# Patient Record
Sex: Male | Born: 1945 | Race: Black or African American | Hispanic: No | Marital: Single | Smoking: Never smoker
Health system: Southern US, Community
[De-identification: ages and names within clinical notes are randomized; demographics above are authoritative.]

## PROBLEM LIST (undated history)

## (undated) DIAGNOSIS — N400 Enlarged prostate without lower urinary tract symptoms: Secondary | ICD-10-CM

## (undated) DIAGNOSIS — D696 Thrombocytopenia, unspecified: Secondary | ICD-10-CM

## (undated) DIAGNOSIS — E538 Deficiency of other specified B group vitamins: Secondary | ICD-10-CM

## (undated) DIAGNOSIS — R569 Unspecified convulsions: Secondary | ICD-10-CM

## (undated) DIAGNOSIS — F01518 Vascular dementia, unspecified severity, with other behavioral disturbance: Secondary | ICD-10-CM

## (undated) DIAGNOSIS — R293 Abnormal posture: Secondary | ICD-10-CM

## (undated) DIAGNOSIS — E119 Type 2 diabetes mellitus without complications: Secondary | ICD-10-CM

## (undated) DIAGNOSIS — R7881 Bacteremia: Secondary | ICD-10-CM

## (undated) DIAGNOSIS — F209 Schizophrenia, unspecified: Secondary | ICD-10-CM

## (undated) DIAGNOSIS — F0151 Vascular dementia with behavioral disturbance: Secondary | ICD-10-CM

## (undated) DIAGNOSIS — D731 Hypersplenism: Secondary | ICD-10-CM

## (undated) DIAGNOSIS — I639 Cerebral infarction, unspecified: Secondary | ICD-10-CM

## (undated) DIAGNOSIS — N179 Acute kidney failure, unspecified: Secondary | ICD-10-CM

## (undated) DIAGNOSIS — R1310 Dysphagia, unspecified: Secondary | ICD-10-CM

## (undated) DIAGNOSIS — G9341 Metabolic encephalopathy: Secondary | ICD-10-CM

## (undated) DIAGNOSIS — F0390 Unspecified dementia without behavioral disturbance: Secondary | ICD-10-CM

## (undated) DIAGNOSIS — F319 Bipolar disorder, unspecified: Secondary | ICD-10-CM

---

## 2017-03-05 ENCOUNTER — Other Ambulatory Visit (HOSPITAL_COMMUNITY): Payer: Self-pay | Admitting: Internal Medicine

## 2017-03-05 DIAGNOSIS — R1310 Dysphagia, unspecified: Secondary | ICD-10-CM

## 2017-03-12 ENCOUNTER — Ambulatory Visit (HOSPITAL_COMMUNITY)
Admission: RE | Admit: 2017-03-12 | Discharge: 2017-03-12 | Disposition: A | Discharge status: Home / Self Care | Payer: Medicare Other | Source: Ambulatory Visit | Attending: Internal Medicine | Admitting: Internal Medicine

## 2017-03-12 DIAGNOSIS — R1310 Dysphagia, unspecified: Secondary | ICD-10-CM

## 2017-03-12 NOTE — Progress Notes (Deleted)
Modified Barium Swallow Progress Note  Patient Details  Name: Aaron Watkins MRN: [id number] Date of Birth: 1945/06/03  Today's Date: 03/12/2017  Modified Barium Swallow completed.  Full report located under Chart Review in the Imaging Section.  Brief recommendations include the following:  Clinical Impression  Pt presents with mildoropharyngeal dysphagia, characterized by sensory and motor impairments. Orally, pt demonstrates poor bolus formation with posterior spill prior to initiation of the swallow reflex. Pharyngeally, pt exhibits spillage to the vallecula on thin, nectar thick and puree consistencies, and spillage to the pyriform sinus on soft solid (due to time and attention to mastication). Penetration and aspiration of thin liquid via straw was noted during the swallow. Immediate, effective reflexive cough was noted. Vallecular residue was noted across consistencies as well, which pt eventually cleared with dry swallows.    Swallow Evaluation Recommendations  SLP Diet Recommendations: Dysphagia 1 (Puree) solids;Thin liquid   Liquid Administration via: Cup;No straw   Medication Administration: Other (Comment)(1 at a time)   Supervision: Patient able to self feed;Staff to assist with self feeding;Full supervision/cueing for compensatory strategies   Compensations: Minimize environmental distractions;Slow rate;Small sips/bites   Postural Changes: Seated upright at 90 degrees   Oral Care Recommendations: Oral care BID     Aaron Watkins, [facility] Hospital[facility]P, CCC-SLP Speech Language Pathologist [phone]  Aaron Watkins, Lucero Auzenne Lorenzi 03/12/2017,12:51 PM

## 2017-03-12 NOTE — Progress Notes (Signed)
Modified Barium Swallow Progress Note  Patient Details  Name: Aaron Watkins MRN: [id number] Date of Birth: 04-04-1946  Today's Date: 03/12/2017  Modified Barium Swallow completed.  Full report located under Chart Review in the Imaging Section.  Brief recommendations include the following:  Clinical Impression   Pt presents with mild oropharyngeal dysphagia, characterized by sensory and motor impairments.   Orally, pt demonstrates poor bolus formation with posterior spill prior to initiation of the swallow reflex.   Pharyngeally, pt exhibits spillage to the vallecula on thin, nectar thick and puree consistencies, and spillage to the pyriform sinus on soft solid (due to time and attention to mastication). Penetration and aspiration during the swallow of thin liquid (via straw only) was noted. Immediate, effective reflexive cough was noted. No penetration or aspiration of thin liquid via cup sip was seen, despite multiple presentations and large/consecutive boluses. Vallecular residue was noted across consistencies as well, which pt eventually cleared with dry swallows. Pt tolerated barium tablet whole with liquid. No difficulty or delay exhibited.   Recommend puree diet, given significant delay in swallow reflex on items requiring mastication, as this significantly increases aspiration risk. Recommend thin liquids via CUP SIP ONLY (NO STRAWS). Meds may be given whole, one at a time with liquid.     Swallow Evaluation Recommendations  SLP Diet Recommendations: Dysphagia 1 (Puree) solids;Thin liquid   Liquid Administration via: Cup;No straw   Medication Administration: Other (Comment)(1 at a time)   Supervision: Patient able to self feed;Staff to assist with self feeding;Full supervision/cueing for compensatory strategies   Compensations: Minimize environmental distractions;Slow rate;Small sips/bites   Postural Changes: Seated upright at 90 degrees   Oral Care Recommendations:  Oral care BID     Celia B. Murvin NatalBueche, [facility] IncMSP, CCC-SLP Speech Language Pathologist [phone]  Leigh AuroraBueche, Celia Hagey 03/12/2017,2:06 PM

## 2018-01-04 ENCOUNTER — Inpatient Hospital Stay (HOSPITAL_COMMUNITY)
Admission: EM | Admit: 2018-01-04 | Discharge: 2018-01-10 | DRG: 871 | Disposition: A | Discharge status: Skilled Nursing Facility | Payer: Medicare Other | Attending: Internal Medicine | Admitting: Internal Medicine

## 2018-01-04 ENCOUNTER — Encounter (HOSPITAL_COMMUNITY): Payer: Self-pay

## 2018-01-04 ENCOUNTER — Emergency Department (HOSPITAL_COMMUNITY): Payer: Medicare Other

## 2018-01-04 DIAGNOSIS — G92 Toxic encephalopathy: Secondary | ICD-10-CM | POA: Diagnosis present

## 2018-01-04 DIAGNOSIS — E870 Hyperosmolality and hypernatremia: Secondary | ICD-10-CM | POA: Diagnosis not present

## 2018-01-04 DIAGNOSIS — Z7189 Other specified counseling: Secondary | ICD-10-CM

## 2018-01-04 DIAGNOSIS — E119 Type 2 diabetes mellitus without complications: Secondary | ICD-10-CM | POA: Diagnosis present

## 2018-01-04 DIAGNOSIS — E860 Dehydration: Secondary | ICD-10-CM | POA: Diagnosis present

## 2018-01-04 DIAGNOSIS — I10 Essential (primary) hypertension: Secondary | ICD-10-CM | POA: Diagnosis present

## 2018-01-04 DIAGNOSIS — Z8673 Personal history of transient ischemic attack (TIA), and cerebral infarction without residual deficits: Secondary | ICD-10-CM

## 2018-01-04 DIAGNOSIS — H9190 Unspecified hearing loss, unspecified ear: Secondary | ICD-10-CM | POA: Diagnosis present

## 2018-01-04 DIAGNOSIS — J690 Pneumonitis due to inhalation of food and vomit: Secondary | ICD-10-CM | POA: Diagnosis present

## 2018-01-04 DIAGNOSIS — E538 Deficiency of other specified B group vitamins: Secondary | ICD-10-CM | POA: Diagnosis present

## 2018-01-04 DIAGNOSIS — Z5320 Procedure and treatment not carried out because of patient's decision for unspecified reasons: Secondary | ICD-10-CM | POA: Diagnosis present

## 2018-01-04 DIAGNOSIS — F209 Schizophrenia, unspecified: Secondary | ICD-10-CM | POA: Diagnosis present

## 2018-01-04 DIAGNOSIS — F0151 Vascular dementia with behavioral disturbance: Secondary | ICD-10-CM | POA: Diagnosis present

## 2018-01-04 DIAGNOSIS — G9340 Encephalopathy, unspecified: Secondary | ICD-10-CM | POA: Diagnosis present

## 2018-01-04 DIAGNOSIS — D751 Secondary polycythemia: Secondary | ICD-10-CM | POA: Diagnosis present

## 2018-01-04 DIAGNOSIS — F0390 Unspecified dementia without behavioral disturbance: Secondary | ICD-10-CM | POA: Diagnosis present

## 2018-01-04 DIAGNOSIS — E876 Hypokalemia: Secondary | ICD-10-CM | POA: Diagnosis present

## 2018-01-04 DIAGNOSIS — N400 Enlarged prostate without lower urinary tract symptoms: Secondary | ICD-10-CM | POA: Diagnosis present

## 2018-01-04 DIAGNOSIS — A419 Sepsis, unspecified organism: Secondary | ICD-10-CM | POA: Diagnosis not present

## 2018-01-04 DIAGNOSIS — R402142 Coma scale, eyes open, spontaneous, at arrival to emergency department: Secondary | ICD-10-CM | POA: Diagnosis present

## 2018-01-04 DIAGNOSIS — R402352 Coma scale, best motor response, localizes pain, at arrival to emergency department: Secondary | ICD-10-CM | POA: Diagnosis present

## 2018-01-04 DIAGNOSIS — R0602 Shortness of breath: Secondary | ICD-10-CM

## 2018-01-04 DIAGNOSIS — R001 Bradycardia, unspecified: Secondary | ICD-10-CM | POA: Diagnosis not present

## 2018-01-04 DIAGNOSIS — Z79899 Other long term (current) drug therapy: Secondary | ICD-10-CM

## 2018-01-04 DIAGNOSIS — R402212 Coma scale, best verbal response, none, at arrival to emergency department: Secondary | ICD-10-CM | POA: Diagnosis present

## 2018-01-04 DIAGNOSIS — X58XXXA Exposure to other specified factors, initial encounter: Secondary | ICD-10-CM | POA: Diagnosis not present

## 2018-01-04 DIAGNOSIS — F319 Bipolar disorder, unspecified: Secondary | ICD-10-CM | POA: Diagnosis present

## 2018-01-04 DIAGNOSIS — R1310 Dysphagia, unspecified: Secondary | ICD-10-CM | POA: Diagnosis present

## 2018-01-04 DIAGNOSIS — T441X5A Adverse effect of other parasympathomimetics [cholinergics], initial encounter: Secondary | ICD-10-CM | POA: Diagnosis not present

## 2018-01-04 DIAGNOSIS — Z515 Encounter for palliative care: Secondary | ICD-10-CM

## 2018-01-04 DIAGNOSIS — D7589 Other specified diseases of blood and blood-forming organs: Secondary | ICD-10-CM | POA: Diagnosis present

## 2018-01-04 DIAGNOSIS — I248 Other forms of acute ischemic heart disease: Secondary | ICD-10-CM | POA: Diagnosis present

## 2018-01-04 HISTORY — DX: Benign prostatic hyperplasia without lower urinary tract symptoms: N40.0

## 2018-01-04 HISTORY — DX: Dysphagia, unspecified: R13.10

## 2018-01-04 HISTORY — DX: Acute kidney failure, unspecified: N17.9

## 2018-01-04 HISTORY — DX: Type 2 diabetes mellitus without complications: E11.9

## 2018-01-04 HISTORY — DX: Unspecified convulsions: R56.9

## 2018-01-04 HISTORY — DX: Bacteremia: R78.81

## 2018-01-04 HISTORY — DX: Vascular dementia with behavioral disturbance: F01.51

## 2018-01-04 HISTORY — DX: Abnormal posture: R29.3

## 2018-01-04 HISTORY — DX: Thrombocytopenia, unspecified: D69.6

## 2018-01-04 HISTORY — DX: Cerebral infarction, unspecified: I63.9

## 2018-01-04 HISTORY — DX: Deficiency of other specified B group vitamins: E53.8

## 2018-01-04 HISTORY — DX: Vascular dementia, unspecified severity, with other behavioral disturbance: F01.518

## 2018-01-04 HISTORY — DX: Schizophrenia, unspecified: F20.9

## 2018-01-04 HISTORY — DX: Bipolar disorder, unspecified: F31.9

## 2018-01-04 HISTORY — DX: Hypersplenism: D73.1

## 2018-01-04 HISTORY — DX: Metabolic encephalopathy: G93.41

## 2018-01-04 HISTORY — DX: Unspecified dementia, unspecified severity, without behavioral disturbance, psychotic disturbance, mood disturbance, and anxiety: F03.90

## 2018-01-04 LAB — URINALYSIS, ROUTINE W REFLEX MICROSCOPIC
BILIRUBIN URINE: NEGATIVE
Glucose, UA: NEGATIVE mg/dL
KETONES UR: NEGATIVE mg/dL
Leukocytes, UA: NEGATIVE
Nitrite: NEGATIVE
PROTEIN: 100 mg/dL — AB
Specific Gravity, Urine: 1.029 (ref 1.005–1.030)
pH: 5 (ref 5.0–8.0)

## 2018-01-04 MED ORDER — SODIUM CHLORIDE 0.9 % IV SOLN
2.0000 g | Freq: Once | INTRAVENOUS | Status: AC
  Administered 2018-01-04: 2 g via INTRAVENOUS
  Filled 2018-01-04: qty 2

## 2018-01-04 MED ORDER — METRONIDAZOLE IN NACL 5-0.79 MG/ML-% IV SOLN
500.0000 mg | Freq: Three times a day (TID) | INTRAVENOUS | Status: DC
  Administered 2018-01-04 – 2018-01-05 (×2): 500 mg via INTRAVENOUS
  Filled 2018-01-04 (×2): qty 100

## 2018-01-04 MED ORDER — VANCOMYCIN HCL IN DEXTROSE 1-5 GM/200ML-% IV SOLN
1000.0000 mg | Freq: Once | INTRAVENOUS | Status: AC
  Administered 2018-01-05: 1000 mg via INTRAVENOUS
  Filled 2018-01-04: qty 200

## 2018-01-04 MED ORDER — ACETAMINOPHEN 650 MG RE SUPP
650.0000 mg | Freq: Once | RECTAL | Status: AC
  Administered 2018-01-04: 650 mg via RECTAL
  Filled 2018-01-04: qty 1

## 2018-01-04 MED ORDER — SODIUM CHLORIDE 0.9 % IV BOLUS (SEPSIS)
1000.0000 mL | Freq: Once | INTRAVENOUS | Status: AC
  Administered 2018-01-04: 1000 mL via INTRAVENOUS

## 2018-01-04 MED ORDER — SODIUM CHLORIDE 0.9 % IV BOLUS (SEPSIS)
250.0000 mL | Freq: Once | INTRAVENOUS | Status: AC
  Administered 2018-01-04: 250 mL via INTRAVENOUS

## 2018-01-04 NOTE — ED Provider Notes (Addendum)
[facility]   CSN: [id number] Arrival date & time: 01/04/18  2256     History   Chief Complaint No chief complaint on file.   HPI Aaron Watkins is a 72 y.o. male.  Patient sent to the emergency department from skilled nursing facility for decreased level of consciousness.  Information provided by EMS.  Caregivers reportedly noted this evening that he was not as alert as normal.  At baseline he is nonverbal, but normally is alert and can indicate yes and no.  Tonight he was noted to be slumped over, diaphoretic, not interacting. Level V Caveat due to condition nonverbal state.     Past Medical History:  Diagnosis Date  . Abnormal posture   . Acute kidney failure (HCC)   . Bacteremia   . Bipolar 1 disorder (HCC)   . BPH (benign prostatic hyperplasia)   . Convulsions (HCC)   . Dementia   . Diabetes mellitus without complication (HCC)   . Dysphagia   . Dysphagia   . Hypersplenism   . Metabolic encephalopathy   . Schizophrenia (HCC)   . Seizures (HCC)   . Stroke (HCC)   . Thrombocytopenia (HCC)   . Vascular dementia with behavior disturbance   . Vitamin B12 deficiency     There are no active problems to display for this patient.         Home Medications    Prior to Admission medications   Not on File    Family History No family history on file.  Social History Social History   Tobacco Use  . Smoking status: Not on file  Substance Use Topics  . Alcohol use: Not on file  . Drug use: Not on file     Allergies   Patient has no allergy information on record.   Review of Systems Review of Systems  Unable to perform ROS: Acuity of condition     Physical Exam Updated Vital Signs BP 117/88   Pulse (!) 141   Temp (S) (!) 101.4 F (38.6 C) (Rectal)   Resp (!) 0   Ht 5\' 7"  (1.702 m)   Wt 70.3 kg   SpO2 100%   BMI 24.28 kg/m   Physical Exam  Constitutional: He appears well-developed.  He appears lethargic.  HENT:  Head: Normocephalic.  Eyes: Pupils are equal, round, and reactive to light.  Neck: No tracheal deviation present.  Cardiovascular: Regular rhythm. Tachycardia present.  Pulmonary/Chest: Tachypnea noted.  Abdominal: Soft.  Musculoskeletal: He exhibits no deformity.  Neurological: He appears lethargic. No cranial nerve deficit. GCS eye subscore is 4. GCS verbal subscore is 1. GCS motor subscore is 5.  Skin: He is diaphoretic.     ED Treatments / Results  Labs (all labs ordered are listed, but only abnormal results are displayed) Labs Reviewed  COMPREHENSIVE METABOLIC PANEL - Abnormal; Notable for the following components:      Result Value   Sodium 159 (*)    Potassium 5.3 (*)    Chloride 118 (*)    Glucose, Bld 223 (*)    BUN 38 (*)    Total Protein 9.5 (*)    Total Bilirubin 1.3 (*)    GFR calc non Af Amer 57 (*)    All other components within normal limits  CBC WITH DIFFERENTIAL/PLATELET - Abnormal; Notable for the following components:   WBC 18.1 (*)    RBC 5.85 (*)    Hemoglobin 18.8 (*)  HCT 61.7 (*)    MCV 105.5 (*)    Neutro Abs 12.2 (*)    Lymphs Abs 4.9 (*)    All other components within normal limits  URINALYSIS, ROUTINE W REFLEX MICROSCOPIC - Abnormal; Notable for the following components:   Color, Urine AMBER (*)    Hgb urine dipstick MODERATE (*)    Protein, ur 100 (*)    Bacteria, UA RARE (*)    All other components within normal limits  I-STAT CG4 LACTIC ACID, ED - Abnormal; Notable for the following components:   Lactic Acid, Venous 4.21 (*)    All other components within normal limits  CULTURE, BLOOD (ROUTINE X 2)  CULTURE, BLOOD (ROUTINE X 2)  URINE CULTURE  I-STAT CG4 LACTIC ACID, ED    EKG EKG Interpretation  Date/Time:  Friday January 04 2018 22:56:36 EDT Ventricular Rate:  141 PR Interval:    QRS Duration: 87 QT Interval:  275 QTC Calculation: 422 R Axis:   23 Text Interpretation:  Sinus tachycardia  Atrial premature complexes Abnormal R-wave progression, early transition ST depression, probably rate related Confirmed by Gilda Crease [phone]) on 01/04/2018 11:49:39 PM   Radiology Dg Chest Port 1 View  Result Date: 01/04/2018 CLINICAL DATA:  sepsis EXAM: PORTABLE CHEST 1 VIEW COMPARISON:  None FINDINGS: Midline trachea. Normal heart size. Atherosclerosis in the transverse aorta. No pleural effusion or pneumothorax. Clear lungs. IMPRESSION: No acute cardiopulmonary disease. Aortic Atherosclerosis (ICD10-I70.0). Electronically Signed   By: Jeronimo Greaves M.D.   On: 01/04/2018 23:48    Procedures .Critical Care Performed by: Gilda Crease, MD Authorized by: Gilda Crease, MD   Critical care provider statement:    Critical care time (minutes):  35   Critical care time was exclusive of:  Separately billable procedures and treating other patients   Critical care was necessary to treat or prevent imminent or life-threatening deterioration of the following conditions:  CNS failure or compromise, sepsis and dehydration   Critical care was time spent personally by me on the following activities:  Ordering and performing treatments and interventions, ordering and review of laboratory studies, discussions with consultants, ordering and review of radiographic studies, pulse oximetry, evaluation of patient's response to treatment, re-evaluation of patient's condition and examination of patient   (including critical care time)  Medications Ordered in ED Medications  metroNIDAZOLE (FLAGYL) IVPB 500 mg (500 mg Intravenous New Bag/Given 01/04/18 2332)  vancomycin (VANCOCIN) IVPB 1000 mg/200 mL premix (has no administration in time range)  ceFEPIme (MAXIPIME) 2 g in sodium chloride 0.9 % 100 mL IVPB (2 g Intravenous New Bag/Given 01/04/18 2331)  acetaminophen (TYLENOL) suppository 650 mg (650 mg Rectal Given 01/04/18 2336)  sodium chloride 0.9 % bolus 1,000 mL (1,000 mLs Intravenous  New Bag/Given 01/04/18 2336)    And  sodium chloride 0.9 % bolus 1,000 mL (1,000 mLs Intravenous New Bag/Given 01/04/18 2336)    And  sodium chloride 0.9 % bolus 250 mL (0 mLs Intravenous Stopped 01/04/18 2343)     Initial Impression / Assessment and Plan / ED Course  I have reviewed the triage vital signs and the nursing notes.  Pertinent labs & imaging results that were available during my care of the patient were reviewed by me and considered in my medical decision making (see chart for details).     Patient sent to the emergency department from skilled nursing facility to be evaluated for altered mental status.  Patient has baseline inability to speak, but is  reportedly normally alert and can interact but is not doing that this evening.  At arrival to the ER he was lethargic, tachycardic, febrile.  No obvious source of fever is present, but patient meets criteria for sepsis.  He was initiated on broad-spectrum antibiotic coverage, fluid bolus.  Tachycardia is improving.  Chest x-ray does not show obvious pneumonia.  Urinalysis does not show obvious infection.  Blood and urine cultures pending.    As patient was hydrated, heart rate has come down.  Blood pressure has dropped slightly as well, but MAP has been okay.  Patient became somewhat agitated, however.  It was unclear if he was experiencing pain.  A CT of his abdomen and pelvis was performed to rule out this as a source of sepsis, as no other source had been noted.  No intra-abdominal or pelvic pathology noted, but does have findings consistent with pneumonia.  Patient will require hospitalization for further management.  Final Clinical Impressions(s) / ED Diagnoses   Final diagnoses:  Sepsis, due to unspecified organism [facility])    ED Discharge Orders    None       Gilda Crease, MD 01/05/18 1308    Gilda Crease, MD 01/05/18 [id number]

## 2018-01-05 ENCOUNTER — Emergency Department (HOSPITAL_COMMUNITY): Payer: Medicare Other

## 2018-01-05 ENCOUNTER — Encounter (HOSPITAL_COMMUNITY): Payer: Self-pay | Admitting: Internal Medicine

## 2018-01-05 DIAGNOSIS — Z5320 Procedure and treatment not carried out because of patient's decision for unspecified reasons: Secondary | ICD-10-CM | POA: Diagnosis present

## 2018-01-05 DIAGNOSIS — I10 Essential (primary) hypertension: Secondary | ICD-10-CM | POA: Diagnosis present

## 2018-01-05 DIAGNOSIS — Z79899 Other long term (current) drug therapy: Secondary | ICD-10-CM | POA: Diagnosis not present

## 2018-01-05 DIAGNOSIS — G92 Toxic encephalopathy: Secondary | ICD-10-CM | POA: Diagnosis present

## 2018-01-05 DIAGNOSIS — D751 Secondary polycythemia: Secondary | ICD-10-CM | POA: Diagnosis present

## 2018-01-05 DIAGNOSIS — N400 Enlarged prostate without lower urinary tract symptoms: Secondary | ICD-10-CM | POA: Diagnosis present

## 2018-01-05 DIAGNOSIS — F0151 Vascular dementia with behavioral disturbance: Secondary | ICD-10-CM | POA: Diagnosis present

## 2018-01-05 DIAGNOSIS — F209 Schizophrenia, unspecified: Secondary | ICD-10-CM

## 2018-01-05 DIAGNOSIS — Z515 Encounter for palliative care: Secondary | ICD-10-CM | POA: Diagnosis not present

## 2018-01-05 DIAGNOSIS — E538 Deficiency of other specified B group vitamins: Secondary | ICD-10-CM | POA: Diagnosis present

## 2018-01-05 DIAGNOSIS — R6510 Systemic inflammatory response syndrome (SIRS) of non-infectious origin without acute organ dysfunction: Secondary | ICD-10-CM

## 2018-01-05 DIAGNOSIS — Z7189 Other specified counseling: Secondary | ICD-10-CM | POA: Diagnosis not present

## 2018-01-05 DIAGNOSIS — A419 Sepsis, unspecified organism: Secondary | ICD-10-CM | POA: Diagnosis present

## 2018-01-05 DIAGNOSIS — X58XXXA Exposure to other specified factors, initial encounter: Secondary | ICD-10-CM | POA: Diagnosis not present

## 2018-01-05 DIAGNOSIS — F0150 Vascular dementia without behavioral disturbance: Secondary | ICD-10-CM | POA: Diagnosis not present

## 2018-01-05 DIAGNOSIS — F0390 Unspecified dementia without behavioral disturbance: Secondary | ICD-10-CM | POA: Diagnosis present

## 2018-01-05 DIAGNOSIS — E870 Hyperosmolality and hypernatremia: Secondary | ICD-10-CM

## 2018-01-05 DIAGNOSIS — D7589 Other specified diseases of blood and blood-forming organs: Secondary | ICD-10-CM | POA: Diagnosis present

## 2018-01-05 DIAGNOSIS — F201 Disorganized schizophrenia: Secondary | ICD-10-CM | POA: Diagnosis not present

## 2018-01-05 DIAGNOSIS — G9340 Encephalopathy, unspecified: Secondary | ICD-10-CM | POA: Diagnosis present

## 2018-01-05 DIAGNOSIS — J690 Pneumonitis due to inhalation of food and vomit: Secondary | ICD-10-CM | POA: Diagnosis present

## 2018-01-05 DIAGNOSIS — R402212 Coma scale, best verbal response, none, at arrival to emergency department: Secondary | ICD-10-CM | POA: Diagnosis present

## 2018-01-05 DIAGNOSIS — E119 Type 2 diabetes mellitus without complications: Secondary | ICD-10-CM | POA: Diagnosis present

## 2018-01-05 DIAGNOSIS — R1310 Dysphagia, unspecified: Secondary | ICD-10-CM | POA: Diagnosis present

## 2018-01-05 DIAGNOSIS — Z8673 Personal history of transient ischemic attack (TIA), and cerebral infarction without residual deficits: Secondary | ICD-10-CM | POA: Diagnosis not present

## 2018-01-05 DIAGNOSIS — F319 Bipolar disorder, unspecified: Secondary | ICD-10-CM | POA: Diagnosis present

## 2018-01-05 DIAGNOSIS — I248 Other forms of acute ischemic heart disease: Secondary | ICD-10-CM | POA: Diagnosis present

## 2018-01-05 DIAGNOSIS — E876 Hypokalemia: Secondary | ICD-10-CM | POA: Diagnosis present

## 2018-01-05 DIAGNOSIS — T441X5A Adverse effect of other parasympathomimetics [cholinergics], initial encounter: Secondary | ICD-10-CM | POA: Diagnosis not present

## 2018-01-05 DIAGNOSIS — R001 Bradycardia, unspecified: Secondary | ICD-10-CM | POA: Diagnosis not present

## 2018-01-05 LAB — COMPREHENSIVE METABOLIC PANEL
ALT: 20 U/L (ref 0–44)
ALT: 16 U/L (ref 0–44)
ANION GAP: 9 (ref 5–15)
AST: 40 U/L (ref 15–41)
AST: 31 U/L (ref 15–41)
Albumin: 4.2 g/dL (ref 3.5–5.0)
Albumin: 3.1 g/dL — ABNORMAL LOW (ref 3.5–5.0)
Alkaline Phosphatase: 85 U/L (ref 38–126)
Alkaline Phosphatase: 59 U/L (ref 38–126)
Anion gap: 15 (ref 5–15)
BUN: 38 mg/dL — ABNORMAL HIGH (ref 8–23)
BUN: 34 mg/dL — ABNORMAL HIGH (ref 8–23)
CHLORIDE: 118 mmol/L — AB (ref 98–111)
CO2: 26 mmol/L (ref 22–32)
CO2: 27 mmol/L (ref 22–32)
CREATININE: 1.22 mg/dL (ref 0.61–1.24)
Calcium: 10.1 mg/dL (ref 8.9–10.3)
Calcium: 8.4 mg/dL — ABNORMAL LOW (ref 8.9–10.3)
Chloride: 123 mmol/L — ABNORMAL HIGH (ref 98–111)
Creatinine, Ser: 1.13 mg/dL (ref 0.61–1.24)
GFR calc Af Amer: >60 mL/min (ref >60)
GFR calc non Af Amer: 57 mL/min — ABNORMAL LOW (ref >60)
GFR calc non Af Amer: >60 mL/min (ref >60)
GLUCOSE: 191 mg/dL — AB (ref 70–99)
Glucose, Bld: 223 mg/dL — ABNORMAL HIGH (ref 70–99)
POTASSIUM: 5.3 mmol/L — AB (ref 3.5–5.1)
POTASSIUM: 4.1 mmol/L (ref 3.5–5.1)
SODIUM: 159 mmol/L — AB (ref 135–145)
Sodium: 159 mmol/L — ABNORMAL HIGH (ref 135–145)
TOTAL PROTEIN: 7 g/dL (ref 6.5–8.1)
Total Bilirubin: 1.3 mg/dL — ABNORMAL HIGH (ref 0.3–1.2)
Total Bilirubin: 1.3 mg/dL — ABNORMAL HIGH (ref 0.3–1.2)
Total Protein: 9.5 g/dL — ABNORMAL HIGH (ref 6.5–8.1)

## 2018-01-05 LAB — CBC WITH DIFFERENTIAL/PLATELET
ABS IMMATURE GRANULOCYTES: 0.1 10*3/uL (ref 0.0–0.1)
ABS IMMATURE GRANULOCYTES: 0.1 10*3/uL (ref 0.0–0.1)
Basophils Absolute: 0.1 10*3/uL (ref 0.0–0.1)
Basophils Absolute: 0 10*3/uL (ref 0.0–0.1)
Basophils Relative: 0 %
Basophils Relative: 0 %
EOS ABS: 0 10*3/uL (ref 0.0–0.7)
Eosinophils Absolute: 0 10*3/uL (ref 0.0–0.7)
Eosinophils Relative: 0 %
Eosinophils Relative: 0 %
HEMATOCRIT: 61.7 % — AB (ref 39.0–52.0)
HEMATOCRIT: 46.4 % (ref 39.0–52.0)
HEMOGLOBIN: 18.8 g/dL — AB (ref 13.0–17.0)
HEMOGLOBIN: 13.7 g/dL (ref 13.0–17.0)
IMMATURE GRANULOCYTES: 0 %
IMMATURE GRANULOCYTES: 1 %
LYMPHS ABS: 4.9 10*3/uL — AB (ref 0.7–4.0)
LYMPHS ABS: 4.8 10*3/uL — AB (ref 0.7–4.0)
LYMPHS PCT: 27 %
Lymphocytes Relative: 27 %
MCH: 32.1 pg (ref 26.0–34.0)
MCH: 31.8 pg (ref 26.0–34.0)
MCHC: 30.5 g/dL (ref 30.0–36.0)
MCHC: 29.5 g/dL — ABNORMAL LOW (ref 30.0–36.0)
MCV: 105.5 fL — AB (ref 78.0–100.0)
MCV: 107.7 fL — ABNORMAL HIGH (ref 78.0–100.0)
MONO ABS: 1.3 10*3/uL — AB (ref 0.1–1.0)
MONOS PCT: 5 %
MONOS PCT: 8 %
Monocytes Absolute: 0.9 10*3/uL (ref 0.1–1.0)
NEUTROS ABS: 11.4 10*3/uL — AB (ref 1.7–7.7)
NEUTROS PCT: 68 %
NEUTROS PCT: 64 %
Neutro Abs: 12.2 10*3/uL — ABNORMAL HIGH (ref 1.7–7.7)
Platelets: 192 10*3/uL (ref 150–400)
Platelets: 141 10*3/uL — ABNORMAL LOW (ref 150–400)
RBC: 5.85 MIL/uL — AB (ref 4.22–5.81)
RBC: 4.31 MIL/uL (ref 4.22–5.81)
RDW: 14.4 % (ref 11.5–15.5)
RDW: 14 % (ref 11.5–15.5)
WBC: 18.1 10*3/uL — AB (ref 4.0–10.5)
WBC: 17.6 10*3/uL — AB (ref 4.0–10.5)

## 2018-01-05 LAB — BASIC METABOLIC PANEL
Anion gap: 4 — ABNORMAL LOW (ref 5–15)
Anion gap: 10 (ref 5–15)
BUN: 28 mg/dL — AB (ref 8–23)
BUN: 23 mg/dL (ref 8–23)
CALCIUM: 8.2 mg/dL — AB (ref 8.9–10.3)
CALCIUM: 8.5 mg/dL — AB (ref 8.9–10.3)
CHLORIDE: 125 mmol/L — AB (ref 98–111)
CHLORIDE: 123 mmol/L — AB (ref 98–111)
CO2: 28 mmol/L (ref 22–32)
CO2: 26 mmol/L (ref 22–32)
CREATININE: 0.75 mg/dL (ref 0.61–1.24)
CREATININE: 0.81 mg/dL (ref 0.61–1.24)
GFR calc Af Amer: >60 mL/min (ref >60)
GFR calc Af Amer: >60 mL/min (ref >60)
GFR calc non Af Amer: >60 mL/min (ref >60)
Glucose, Bld: 128 mg/dL — ABNORMAL HIGH (ref 70–99)
Glucose, Bld: 135 mg/dL — ABNORMAL HIGH (ref 70–99)
Potassium: 3.1 mmol/L — ABNORMAL LOW (ref 3.5–5.1)
Potassium: 3.3 mmol/L — ABNORMAL LOW (ref 3.5–5.1)
SODIUM: 159 mmol/L — AB (ref 135–145)
Sodium: 157 mmol/L — ABNORMAL HIGH (ref 135–145)

## 2018-01-05 LAB — HEPATIC FUNCTION PANEL
ALT: 14 U/L (ref 0–44)
AST: 16 U/L (ref 15–41)
Albumin: 2.8 g/dL — ABNORMAL LOW (ref 3.5–5.0)
Alkaline Phosphatase: 54 U/L (ref 38–126)
BILIRUBIN INDIRECT: 0.4 mg/dL (ref 0.3–0.9)
Bilirubin, Direct: 0.2 mg/dL (ref 0.0–0.2)
TOTAL PROTEIN: 6.5 g/dL (ref 6.5–8.1)
Total Bilirubin: 0.6 mg/dL (ref 0.3–1.2)

## 2018-01-05 LAB — TROPONIN I
Troponin I: 0.04 ng/mL (ref <0.03)
Troponin I: 0.04 ng/mL (ref <0.03)
Troponin I: 0.04 ng/mL (ref <0.03)

## 2018-01-05 LAB — GLUCOSE, CAPILLARY
Glucose-Capillary: 97 mg/dL (ref 70–99)
Glucose-Capillary: 109 mg/dL — ABNORMAL HIGH (ref 70–99)
Glucose-Capillary: 115 mg/dL — ABNORMAL HIGH (ref 70–99)

## 2018-01-05 LAB — CK: CK TOTAL: 104 U/L (ref 49–397)

## 2018-01-05 LAB — PROCALCITONIN

## 2018-01-05 LAB — LACTIC ACID, PLASMA
Lactic Acid, Venous: 1.7 mmol/L (ref 0.5–1.9)
Lactic Acid, Venous: 2.5 mmol/L (ref 0.5–1.9)

## 2018-01-05 LAB — VALPROIC ACID LEVEL: VALPROIC ACID LVL: 15 ug/mL — AB (ref 50.0–100.0)

## 2018-01-05 LAB — CBG MONITORING, ED
GLUCOSE-CAPILLARY: 115 mg/dL — AB (ref 70–99)
Glucose-Capillary: 129 mg/dL — ABNORMAL HIGH (ref 70–99)

## 2018-01-05 LAB — APTT: APTT: 30 s (ref 24–36)

## 2018-01-05 LAB — PROTIME-INR
INR: 1.26
PROTHROMBIN TIME: 15.7 s — AB (ref 11.4–15.2)

## 2018-01-05 LAB — I-STAT CG4 LACTIC ACID, ED
LACTIC ACID, VENOUS: 3.02 mmol/L — AB (ref 0.5–1.9)
Lactic Acid, Venous: 4.21 mmol/L (ref 0.5–1.9)

## 2018-01-05 LAB — MRSA PCR SCREENING: MRSA by PCR: NEGATIVE

## 2018-01-05 MED ORDER — LACTATED RINGERS IV SOLN
INTRAVENOUS | Status: DC
  Administered 2018-01-05: 06:00:00 via INTRAVENOUS

## 2018-01-05 MED ORDER — LACTATED RINGERS IV BOLUS
1000.0000 mL | Freq: Once | INTRAVENOUS | Status: AC
  Administered 2018-01-05: 1000 mL via INTRAVENOUS

## 2018-01-05 MED ORDER — ENOXAPARIN SODIUM 40 MG/0.4ML ~~LOC~~ SOLN
40.0000 mg | Freq: Every day | SUBCUTANEOUS | Status: DC
  Administered 2018-01-05 – 2018-01-10 (×4): 40 mg via SUBCUTANEOUS
  Filled 2018-01-05 (×7): qty 0.4

## 2018-01-05 MED ORDER — ORAL CARE MOUTH RINSE
15.0000 mL | Freq: Two times a day (BID) | OROMUCOSAL | Status: DC
  Administered 2018-01-06 – 2018-01-10 (×7): 15 mL via OROMUCOSAL

## 2018-01-05 MED ORDER — INSULIN ASPART 100 UNIT/ML ~~LOC~~ SOLN: 0.0000 [IU] | SUBCUTANEOUS | Status: DC

## 2018-01-05 MED ORDER — VITAMIN B-12 1000 MCG PO TABS
1000.0000 ug | ORAL_TABLET | Freq: Every day | ORAL | Status: DC
  Filled 2018-01-05: qty 1

## 2018-01-05 MED ORDER — ACETAMINOPHEN 325 MG PO TABS: 650.0000 mg | ORAL_TABLET | Freq: Four times a day (QID) | ORAL | Status: DC | PRN

## 2018-01-05 MED ORDER — LORAZEPAM 2 MG/ML IJ SOLN
1.0000 mg | Freq: Once | INTRAMUSCULAR | Status: AC
  Administered 2018-01-05: 1 mg via INTRAVENOUS

## 2018-01-05 MED ORDER — IPRATROPIUM-ALBUTEROL 0.5-2.5 (3) MG/3ML IN SOLN
3.0000 mL | Freq: Two times a day (BID) | RESPIRATORY_TRACT | Status: DC
  Administered 2018-01-05: 3 mL via RESPIRATORY_TRACT
  Filled 2018-01-05: qty 3

## 2018-01-05 MED ORDER — ACETAMINOPHEN 650 MG RE SUPP: 650.0000 mg | Freq: Four times a day (QID) | RECTAL | Status: DC | PRN

## 2018-01-05 MED ORDER — POTASSIUM CL IN DEXTROSE 5% 20 MEQ/L IV SOLN
20.0000 meq | INTRAVENOUS | Status: DC
  Administered 2018-01-05 – 2018-01-06 (×3): 20 meq via INTRAVENOUS
  Filled 2018-01-05 (×3): qty 1000

## 2018-01-05 MED ORDER — ONDANSETRON HCL 4 MG PO TABS: 4.0000 mg | ORAL_TABLET | Freq: Four times a day (QID) | ORAL | Status: DC | PRN

## 2018-01-05 MED ORDER — LORAZEPAM 2 MG/ML IJ SOLN
INTRAMUSCULAR | Status: AC
  Filled 2018-01-05: qty 1

## 2018-01-05 MED ORDER — DIVALPROEX SODIUM 125 MG PO CSDR
250.0000 mg | DELAYED_RELEASE_CAPSULE | Freq: Three times a day (TID) | ORAL | Status: DC
  Filled 2018-01-05 (×3): qty 2

## 2018-01-05 MED ORDER — DIVALPROEX SODIUM 125 MG PO CSDR
125.0000 mg | DELAYED_RELEASE_CAPSULE | Freq: Every day | ORAL | Status: DC
  Filled 2018-01-05: qty 1

## 2018-01-05 MED ORDER — LACTATED RINGERS IV SOLN: INTRAVENOUS | Status: DC

## 2018-01-05 MED ORDER — SODIUM CHLORIDE 0.45 % IV BOLUS
500.0000 mL | Freq: Once | INTRAVENOUS | Status: AC
  Administered 2018-01-05: 500 mL via INTRAVENOUS

## 2018-01-05 MED ORDER — ONDANSETRON HCL 4 MG/2ML IJ SOLN: 4.0000 mg | Freq: Four times a day (QID) | INTRAMUSCULAR | Status: DC | PRN

## 2018-01-05 MED ORDER — FENTANYL CITRATE (PF) 100 MCG/2ML IJ SOLN
INTRAMUSCULAR | Status: AC
  Filled 2018-01-05: qty 2

## 2018-01-05 MED ORDER — SODIUM CHLORIDE 0.9 % IV SOLN: Freq: Once | INTRAVENOUS | Status: DC

## 2018-01-05 MED ORDER — POTASSIUM CL IN DEXTROSE 5% 20 MEQ/L IV SOLN: 20.0000 meq | INTRAVENOUS | Status: DC

## 2018-01-05 MED ORDER — VANCOMYCIN HCL IN DEXTROSE 750-5 MG/150ML-% IV SOLN
750.0000 mg | Freq: Two times a day (BID) | INTRAVENOUS | Status: DC
  Filled 2018-01-05: qty 150

## 2018-01-05 MED ORDER — SERTRALINE HCL 100 MG PO TABS
100.0000 mg | ORAL_TABLET | Freq: Every day | ORAL | Status: DC
  Filled 2018-01-05: qty 1

## 2018-01-05 MED ORDER — SODIUM CHLORIDE 0.9 % IV SOLN
INTRAVENOUS | Status: DC | PRN
  Administered 2018-01-05: 500 mL via INTRAVENOUS

## 2018-01-05 MED ORDER — DIVALPROEX SODIUM 125 MG PO CSDR: 125.0000 mg | DELAYED_RELEASE_CAPSULE | ORAL | Status: DC

## 2018-01-05 MED ORDER — VALPROATE SODIUM 500 MG/5ML IV SOLN
250.0000 mg | Freq: Once | INTRAVENOUS | Status: AC
  Administered 2018-01-05: 250 mg via INTRAVENOUS
  Filled 2018-01-05: qty 2.5

## 2018-01-05 MED ORDER — IPRATROPIUM-ALBUTEROL 0.5-2.5 (3) MG/3ML IN SOLN: 3.0000 mL | RESPIRATORY_TRACT | Status: DC | PRN

## 2018-01-05 MED ORDER — SODIUM CHLORIDE 0.9 % IV SOLN
1.0000 g | Freq: Two times a day (BID) | INTRAVENOUS | Status: DC
  Filled 2018-01-05: qty 1

## 2018-01-05 MED ORDER — DONEPEZIL HCL 5 MG PO TABS
5.0000 mg | ORAL_TABLET | Freq: Every day | ORAL | Status: DC
  Filled 2018-01-05: qty 1

## 2018-01-05 MED ORDER — CHLORHEXIDINE GLUCONATE 0.12 % MT SOLN
15.0000 mL | Freq: Two times a day (BID) | OROMUCOSAL | Status: DC
  Administered 2018-01-06 – 2018-01-10 (×6): 15 mL via OROMUCOSAL
  Filled 2018-01-05 (×8): qty 15

## 2018-01-05 MED ORDER — SODIUM CHLORIDE 0.9 % IV BOLUS: 1000.0000 mL | Freq: Once | INTRAVENOUS | Status: DC

## 2018-01-05 MED ORDER — RISPERIDONE 0.5 MG PO TABS
1.0000 mg | ORAL_TABLET | Freq: Two times a day (BID) | ORAL | Status: DC
  Filled 2018-01-05 (×2): qty 1

## 2018-01-05 MED ORDER — FENTANYL CITRATE (PF) 100 MCG/2ML IJ SOLN
25.0000 ug | Freq: Once | INTRAMUSCULAR | Status: AC
  Administered 2018-01-05: 25 ug via INTRAVENOUS

## 2018-01-05 MED ORDER — IOHEXOL 300 MG/ML  SOLN
100.0000 mL | Freq: Once | INTRAMUSCULAR | Status: AC | PRN
  Administered 2018-01-05: 100 mL via INTRAVENOUS

## 2018-01-05 NOTE — Progress Notes (Signed)
Pharmacy Antibiotic Note  Aaron Watkins is a 72 y.o. male admitted on 01/04/2018 with AMS/possible sepsis.  Pharmacy has been consulted for Vancomycin and Cefepime  Dosing.  Vancomycin 1 g IV given in ED at  0045  Plan: Vancomycin 750 mg IV q12h Cefepime 1 g IV q12h  Height: 5\' 7"  (170.2 cm) Weight: 155 lb (70.3 kg) IBW/kg (Calculated) : 66.1  Temp (24hrs), Avg:101.4 F (38.6 C), Min:101.4 F (38.6 C), Max:101.4 F (38.6 C)  Recent Labs  Lab 01/04/18 2305 01/04/18 2354 01/05/18 0132 01/05/18 0136  WBC 18.1*  --   --   --   CREATININE 1.22  --  1.13  --   LATICACIDVEN  --  4.21*  --  3.02*    Estimated Creatinine Clearance: 55.2 mL/min (by C-G formula based on SCr of 1.13 mg/dL).    No Known Allergies  Aaron Watkins, Aaron Watkins 01/05/2018 6:05 AM

## 2018-01-05 NOTE — Progress Notes (Signed)
[facility] TEAM 1 - Stepdown/ICU TEAM  Aaron Watkins  EAV:[id number] DOB: 10-Feb-1946 DOA: 01/04/2018 PCP: Mamie Laurel, MD    Brief Narrative:  72 y.o. male SNF resident w/ a hx of dementia, stroke, diabetes mellitus, schizophrenia and bipolar disorder who was brought to the ER due to AMS. Patient  is nonverbal at baseline.    In the ER patient was tachycardic with fever of 101.4 F and lactate of 4.2.  CT head was unremarkable. CT abdom noted a right-sided PNA. Labs also noted polycythemia and hypernatremia.  Significant Events: 9/14 admit  Subjective: Pt is seen for a f/u visit.    Assessment & Plan:  SIRS v/s Sepsis  No clear infectious source at this time - CT abdom notes RLL ground glass nodule, not an actual infiltrate - procalcitonin argues against acute pulm infection - stop abx and follow   Hypernatremia  Recent Labs  Lab 01/04/18 2305 01/05/18 0132 01/05/18 0541 01/05/18 0906  NA 159* 159* 157* 159*      Hypokalemia   Macrocytosis   Elevated troponin  Acute encephalopathy - history of dementia   Schizophrenia and bipolar disorder on Risperdal and Depakote  DVT prophylaxis: lovenox Code Status: FULL CODE Family Communication: no family present  Disposition Plan: SDU  Consultants:  none  Antimicrobials:  Cefepime 9/13 > 9/14 Vanc 9/13  Objective: Blood pressure 106/65, pulse 96, temperature (S) (!) 101.4 F (38.6 C), temperature source Rectal, resp. rate 16, height 5\' 7"  (1.702 m), weight 70.3 kg, SpO2 98 %.  Intake/Output Summary (Last 24 hours) at 01/05/2018 1036 Last data filed at 01/05/2018 0521 Gross per 24 hour  Intake 3414.41 ml  Output -  Net 3414.41 ml   Filed Weights   01/04/18 2328  Weight: 70.3 kg    Examination: Pt was seen for a f/u visit.    CBC: Recent Labs  Lab 01/04/18 2305 01/05/18 0541  WBC 18.1* 17.6*  NEUTROABS 12.2* 11.4*  HGB 18.8* 13.7  HCT 61.7* 46.4  MCV 105.5* 107.7*  PLT 192 141*   Basic  Metabolic Panel: Recent Labs  Lab 01/04/18 2305 01/05/18 0132 01/05/18 0541 01/05/18 0906  NA 159* 159* 157* 159*  K 5.3* 4.1 3.1* 3.3*  CL 118* 123* 125* 123*  CO2 26 27 28 26   GLUCOSE 223* 191* 128* 135*  BUN 38* 34* 28* 23  CREATININE 1.22 1.13 0.75 0.81  CALCIUM 10.1 8.4* 8.2* 8.5*   GFR: Estimated Creatinine Clearance: 77.1 mL/min (by C-G formula based on SCr of 0.81 mg/dL).  Liver Function Tests: Recent Labs  Lab 01/04/18 2305 01/05/18 0132 01/05/18 0541  AST 40 31 16  ALT 20 16 14   ALKPHOS 85 59 54  BILITOT 1.3* 1.3* 0.6  PROT 9.5* 7.0 6.5  ALBUMIN 4.2 3.1* 2.8*    Coagulation Profile: Recent Labs  Lab 01/05/18 0541  INR 1.26    Cardiac Enzymes: Recent Labs  Lab 01/05/18 0132 01/05/18 0541 01/05/18 0906  CKTOTAL 104  --   --   TROPONINI 0.04* 0.04* 0.04*    CBG: Recent Labs  Lab 01/05/18 0538  GLUCAP 115*    Recent Results (from the past 240 hour(s))  Blood Culture (routine x 2)     Status: None (Preliminary result)   Collection Time: 01/04/18 11:17 PM  Result Value Ref Range Status   Specimen Description BLOOD LEFT HAND  Final   Special Requests   Final    BOTTLES DRAWN AEROBIC AND ANAEROBIC Blood Culture adequate volume  Culture   Final    NO GROWTH < 12 HOURS Performed at [facility], [address] [place], [place] [address]    Report Status PENDING  Incomplete     Scheduled Meds: . divalproex  250 mg Oral TID   And  . divalproex  125 mg Oral QHS  . donepezil  5 mg Oral QHS  . enoxaparin (LOVENOX) injection  40 mg Subcutaneous Daily  . fentaNYL      . insulin aspart  0-9 Units Subcutaneous Q4H  . ipratropium-albuterol  3 mL Nebulization BID  . LORazepam      . risperiDONE  1 mg Oral BID  . sertraline  100 mg Oral Daily  . vitamin B-12  1,000 mcg Oral Daily   Continuous Infusions: . ceFEPime (MAXIPIME) IV    . lactated ringers 150 mL/hr at 01/05/18 0546  . metronidazole 500 mg (01/05/18 0948)  . vancomycin         LOS: 0 days   Time spent: No Charge  Lonia BloodJeffrey T. McClung, MD [facility] Office  [phone] Pager - Text Page per Amion as per below:  On-Call/Text Page:      [email or web address]  If 7PM-7AM, please contact night-coverage [email or web address] 01/05/2018, 10:36 AM

## 2018-01-05 NOTE — Progress Notes (Signed)
Aaron Simslexander Ebrahimi [id number] Admission Data: 01/05/2018 4:27 PM Attending Provider: [name], [name], MD  BJY:NWGNFAPCP:[name], Charmian MuffEmanuel, MD Consults/ Treatment Team:   Aaron Watkins is a 72 y.o. male patient admitted from ED,  Full Code, VSS - Blood pressure (!) 117/59, pulse 75, temperature (!) 97.5 F (36.4 C), temperature source Axillary, resp. rate 14, height 5\' 7"  (1.702 m), weight 70.3 kg, SpO2 100 %., on RA, no distress noted. Tele # M02 placed and pt is currently running:normal sinus rhythm.   IV site WDL:  hand right and forearm right, condition patent and no redness with a transparent dsg that's clean dry and intact.  Allergies:  No Known Allergies   Past Medical History:  Diagnosis Date  . Abnormal posture   . Acute kidney failure (HCC)   . Bacteremia   . Bipolar 1 disorder (HCC)   . BPH (benign prostatic hyperplasia)   . Convulsions (HCC)   . Dementia   . Diabetes mellitus without complication (HCC)   . Dysphagia   . Dysphagia   . Hypersplenism   . Metabolic encephalopathy   . Schizophrenia (HCC)   . Seizures (HCC)   . Stroke (HCC)   . Thrombocytopenia (HCC)   . Vascular dementia with behavior disturbance   . Vitamin B12 deficiency     History:  obtained from chart review.  Admission INP armband ID verified with two verifiers. Skin, clean-dry- intact without evidence of bruising, or skin tears. No evidence of skin break down noted on exam.  Patient nonverbal and unable to verbalize needs. Bed alarm on and bed placed in lowest position.   Will cont to monitor and assist as needed.  Lyndal Pulleyassidy L Annaliyah Willig, RN 01/05/2018 4:27 PM

## 2018-01-05 NOTE — ED Notes (Signed)
Pt combative with lab draw -- trying to bite, kick and hit, crying and moaning. Pt not responsive enough at this time for PO meds. Will advise MD.

## 2018-01-05 NOTE — H&P (Signed)
History and Physical    [name] OZH:[id number] DOB: 1945/10/30 DOA: 01/04/2018  PCP: Mamie Laurel, MD  Patient coming from: Skilled nursing facility.  Chief Complaint: Altered mental status.  History obtained from ER physician as patient is encephalopathic demented and no family at the bedside.  No prior charts are available.  HPI: Aaron Watkins is a 72 y.o. male with history of dementia, stroke, diabetes mellitus, schizophrenia and bipolar disorder was brought to the ER after patient's provider at the nursing home found the patient was less responsive than usual.  Patient has advanced dementia and stroke and usually is nonverbal.  Since patient was found to be less responsive patient was referred to the ER.  There was no note of any nausea vomiting or problems breathing or chest pain abdominal pain or diarrhea.  ED Course: In the ER patient was tachycardic with fever of 101.4 F lactate of 4.2.  CT head was unremarkable CT abdomen was done which shows a right-sided pneumonic process.  Patient's labs also show polycythemia and hypernatremia likely from dehydration.  Was given sepsis protocol fluid bolus following which lactate improved to 3.  Was started on empiric antibiotics.  On my exam patient responds to verbal stimuli does not follow commands.  Face appears symmetrical probably from previous stroke.  Review of Systems: As per HPI, rest all negative.   Past Medical History:  Diagnosis Date  . Abnormal posture   . Acute kidney failure (HCC)   . Bacteremia   . Bipolar 1 disorder (HCC)   . BPH (benign prostatic hyperplasia)   . Convulsions (HCC)   . Dementia   . Diabetes mellitus without complication (HCC)   . Dysphagia   . Dysphagia   . Hypersplenism   . Metabolic encephalopathy   . Schizophrenia (HCC)   . Seizures (HCC)   . Stroke (HCC)   . Thrombocytopenia (HCC)   . Vascular dementia with behavior disturbance   . Vitamin B12 deficiency     History reviewed.  No pertinent surgical history.   reports that he has never smoked. He has never used smokeless tobacco. His alcohol and drug histories are not on file.  No Known Allergies  Family History  Family history unknown: Yes    Prior to Admission medications   Medication Sig Start Date End Date Taking? Authorizing Provider  divalproex (DEPAKOTE SPRINKLE) 125 MG capsule Take 125-250 mg by mouth See admin instructions. Take 2 capsules by mouth 3 times daily (0930, 1330, & 1830) and 1 capsule at bedtime   Yes [provider]  donepezil (ARICEPT) 5 MG tablet Take 5 mg by mouth at bedtime.   Yes [provider]  ipratropium-albuterol (DUONEB) 0.5-2.5 (3) MG/3ML SOLN Take 3 mLs by nebulization See admin instructions. Use 1 vial via nebulizer every 12 hours scheduled, and 1 vial via nebulizer every 4 hours as needed for shortness of breath   Yes [provider]  lactose free nutrition (BOOST) LIQD Take 237 mLs by mouth 3 (three) times daily with meals. Vanilla   Yes [provider]  risperiDONE (RISPERDAL) 1 MG tablet Take 1 mg by mouth 2 (two) times daily.   Yes [provider]  sertraline (ZOLOFT) 100 MG tablet Take 100 mg by mouth daily.   Yes [provider]  vitamin B-12 (CYANOCOBALAMIN) 1000 MCG tablet Take 1,000 mcg by mouth daily.   Yes [provider]    Physical Exam: Vitals:   01/05/18 0230 01/05/18 0300 01/05/18 0400 01/05/18  0430  BP: 104/69 101/64 113/75 117/71  Pulse: (!) 111 (!) 109 (!) 112 (!) 111  Resp: 14 13 (!) 21 20  Temp:      TempSrc:      SpO2: 99% 99% 100% 99%  Weight:      Height:          Constitutional: Moderately built and nourished. Vitals:   01/05/18 0230 01/05/18 0300 01/05/18 0400 01/05/18 0430  BP: 104/69 101/64 113/75 117/71  Pulse: (!) 111 (!) 109 (!) 112 (!) 111  Resp: 14 13 (!) 21 20  Temp:      TempSrc:      SpO2: 99% 99% 100% 99%  Weight:      Height:       Eyes: Anicteric no  pallor. ENMT: No discharge from the ears eyes nose or mouth. Neck: No mass palpated no neck rigidity.  No JVD appreciated. Respiratory: No rhonchi or crepitations. Cardiovascular: S1-S2 heard no murmurs appreciated. Abdomen: Soft nontender bowel sounds present. Musculoskeletal: No edema.  No joint effusion. Skin: No rash. Neurologic: Lethargic response to verbal stimuli but does not follow commands. Psychiatric: Lethargic.   Labs on Admission: I have personally reviewed following labs and imaging studies  CBC: Recent Labs  Lab 01/04/18 2305  WBC 18.1*  NEUTROABS 12.2*  HGB 18.8*  HCT 61.7*  MCV 105.5*  PLT 192   Basic Metabolic Panel: Recent Labs  Lab 01/04/18 2305 01/05/18 0132  NA 159* 159*  K 5.3* 4.1  CL 118* 123*  CO2 26 27  GLUCOSE 223* 191*  BUN 38* 34*  CREATININE 1.22 1.13  CALCIUM 10.1 8.4*   GFR: Estimated Creatinine Clearance: 55.2 mL/min (by C-G formula based on SCr of 1.13 mg/dL). Liver Function Tests: Recent Labs  Lab 01/04/18 2305 01/05/18 0132  AST 40 31  ALT 20 16  ALKPHOS 85 59  BILITOT 1.3* 1.3*  PROT 9.5* 7.0  ALBUMIN 4.2 3.1*   No results for input(s): LIPASE, AMYLASE in the last 168 hours. No results for input(s): AMMONIA in the last 168 hours. Coagulation Profile: No results for input(s): INR, PROTIME in the last 168 hours. Cardiac Enzymes: Recent Labs  Lab 01/05/18 0132  CKTOTAL 104  TROPONINI 0.04*   BNP (last 3 results) No results for input(s): PROBNP in the last 8760 hours. HbA1C: No results for input(s): HGBA1C in the last 72 hours. CBG: No results for input(s): GLUCAP in the last 168 hours. Lipid Profile: No results for input(s): CHOL, HDL, LDLCALC, TRIG, CHOLHDL, LDLDIRECT in the last 72 hours. Thyroid Function Tests: No results for input(s): TSH, T4TOTAL, FREET4, T3FREE, THYROIDAB in the last 72 hours. Anemia Panel: No results for input(s): VITAMINB12, FOLATE, FERRITIN, TIBC, IRON, RETICCTPCT in the last 72  hours. Urine analysis:    Component Value Date/Time   COLORURINE AMBER (A) 01/04/2018 2305   APPEARANCEUR CLEAR 01/04/2018 2305   LABSPEC 1.029 01/04/2018 2305   PHURINE 5.0 01/04/2018 2305   GLUCOSEU NEGATIVE 01/04/2018 2305   HGBUR MODERATE (A) 01/04/2018 2305   BILIRUBINUR NEGATIVE 01/04/2018 2305   KETONESUR NEGATIVE 01/04/2018 2305   PROTEINUR 100 (A) 01/04/2018 2305   NITRITE NEGATIVE 01/04/2018 2305   LEUKOCYTESUR NEGATIVE 01/04/2018 2305   Sepsis Labs: @LABRCNTIP (procalcitonin:4,lacticidven:4) )No results found for this or any previous visit (from the past 240 hour(s)).   Radiological Exams on Admission: Ct Head Wo Contrast  Result Date: 01/05/2018 CLINICAL DATA:  Altered level of consciousness. EXAM: CT HEAD WITHOUT CONTRAST TECHNIQUE: Contiguous axial images were  obtained from the base of the skull through the vertex without intravenous contrast. COMPARISON:  None. FINDINGS: Brain: Generalized atrophy, advanced for age. Moderate chronic small vessel ischemia. No intracranial hemorrhage, mass effect, or midline shift. Ventricular prominence likely due to central atrophy. The basilar cisterns are patent. No evidence of territorial infarct or acute ischemia. No extra-axial or intracranial fluid collection. Vascular: Atherosclerosis of skullbase vasculature without hyperdense vessel or abnormal calcification. Skull: No fracture or focal lesion. Sinuses/Orbits: Paranasal sinuses and mastoid air cells are clear. The visualized orbits are unremarkable. Dysconjugate gaze. Other: None. IMPRESSION: 1.  No acute intracranial abnormality. 2. Age advanced atrophy with moderate chronic small vessel ischemia. Electronically Signed   By: Narda Rutherford M.D.   On: 01/05/2018 02:49   Ct Abdomen Pelvis W Contrast  Result Date: 01/05/2018 CLINICAL DATA:  Sepsis. History of diabetes, benign prostatic hypertrophy, hyper splenosis. EXAM: CT ABDOMEN AND PELVIS WITH CONTRAST TECHNIQUE: Multidetector CT  imaging of the abdomen and pelvis was performed using the standard protocol following bolus administration of intravenous contrast. CONTRAST:  OMNIPAQUE IOHEXOL 300 MG/ML  SOLN COMPARISON:  None. FINDINGS: LOWER CHEST: RIGHT lower lobe peribronchial ground-glass nodules measuring to 6 mm. Bibasilar dependent atelectasis. HEPATOBILIARY: Liver and gallbladder are normal. PANCREAS: Nonacute, Atrophic. SPLEEN: Normal. ADRENALS/URINARY TRACT: Kidneys are orthotopic, demonstrating symmetric enhancement. No nephrolithiasis, hydronephrosis or solid renal masses. Too small to characterize hypodensities bilateral kidneys. The unopacified ureters are normal in course and caliber. Delayed imaging through the kidneys demonstrates symmetric prompt contrast excretion within the proximal urinary collecting system. Urinary bladder is partially distended and unremarkable. Normal adrenal glands. STOMACH/BOWEL: Mild gas distended distal esophagus seen with reflux. The stomach, small and large bowel are normal in course and caliber without inflammatory changes. Small duodenal diverticulum. Small amount of retained large bowel stool. Mild rectal wall thickening without inflammatory changes associated with internal hemorrhoids. Mild colonic diverticulosis. Normal appendix. VASCULAR/LYMPHATIC: Aortoiliac vessels are normal in course and caliber. Moderate intimal thickening calcific atherosclerosis. Severe stenosis bilateral internal iliac arteries. No lymphadenopathy by CT size criteria. REPRODUCTIVE: Mild prostatomegaly. OTHER: No intraperitoneal free fluid or free air. Small fat containing inguinal hernias. MUSCULOSKELETAL: Nonacute. Mild chronic appearing T12 and L5 superior endplate compression fractures. Moderate to severe lower lumbar spondylosis. Osteopenia. IMPRESSION: 1. RIGHT lower lobe ground-glass nodules may be infectious or inflammatory (potentially from aspiration). Non-contrast chest CT at 3-6 months is recommended.  If nodules persist, subsequent management will be based upon the most suspicious nodule(s). This recommendation follows the consensus statement: Guidelines for Management of Incidental Pulmonary Nodules Detected on CT Images: From the Fleischner Society 2017; Radiology 2017; 284:228-243. 2. No acute intra-abdominal or pelvic process. Electronically Signed   By: Awilda Metro M.D.   On: 01/05/2018 03:01   Dg Chest Port 1 View  Result Date: 01/04/2018 CLINICAL DATA:  sepsis EXAM: PORTABLE CHEST 1 VIEW COMPARISON:  None FINDINGS: Midline trachea. Normal heart size. Atherosclerosis in the transverse aorta. No pleural effusion or pneumothorax. Clear lungs. IMPRESSION: No acute cardiopulmonary disease. Aortic Atherosclerosis (ICD10-I70.0). Electronically Signed   By: Jeronimo Greaves M.D.   On: 01/04/2018 23:48    EKG: Independently reviewed.  Sinus tachycardia with nonspecific ST-T changes.  Assessment/Plan Principal Problem:   Sepsis (HCC) Active Problems:   Acute encephalopathy   Hypernatremia   Diabetes mellitus type 2 in nonobese (HCC)   Schizophrenia (HCC)   Dementia   History of completed stroke    1. Sepsis likely source could be pneumonia possibly aspiration -follow blood cultures  follow lactate levels procalcitonin levels get swallow evaluation.  Continue with empiric antibiotics.  Continue with aggressive hydration. 2. Hypernatremia -likely from dehydration.  Continue with hydration and closely follow metabolic panel and sodium trends. 3. Elevated troponin likely from sepsis.  Cycle cardiac markers. 4. No evidence mellitus type II presently not on any medications.  Will keep patient on sliding scale coverage. 5. Acute encephalopathy with history of dementia likely worsened from sepsis. 6. History of schizophrenia and bipolar disorder on Risperdal and Depakote.  Check Depakote levels.   Will need to get further history in the morning once family is available. Patient presently is a  full code and the chart from the facility does not mention about the CODE STATUS.   DVT prophylaxis: Lovenox. Code Status: Full code. Family Communication: No family at the bedside. Disposition Plan: Back to nursing facility. Consults called: Swallow evaluation. Admission status: Inpatient.   Eduard Clos MD [facility] Pager [phone].  If 7PM-7AM, please contact night-coverage [email or web address] Password [facility]  01/05/2018, 4:57 AM

## 2018-01-05 NOTE — ED Notes (Signed)
Dr Blinda LeatherwoodPollina informed of lactic acid results 4.21

## 2018-01-05 NOTE — ED Notes (Signed)
MD paged regarding pt BP and increased Lactic

## 2018-01-05 NOTE — ED Notes (Signed)
Dr Blinda LeatherwoodPollina informed of lactic acid results 3.02

## 2018-01-05 NOTE — ED Notes (Signed)
On return from CT, CT transporter reported #22 in hand "just came out". IV was present prior to transport to CT. IV sitting in sheets, dislodged from site. IV removed, gauze applied.

## 2018-01-06 DIAGNOSIS — R001 Bradycardia, unspecified: Secondary | ICD-10-CM

## 2018-01-06 LAB — URINE CULTURE: CULTURE: NO GROWTH

## 2018-01-06 LAB — CBC
HCT: 41.6 % (ref 39.0–52.0)
Hemoglobin: 12.8 g/dL — ABNORMAL LOW (ref 13.0–17.0)
MCH: 32.4 pg (ref 26.0–34.0)
MCHC: 30.8 g/dL (ref 30.0–36.0)
MCV: 105.3 fL — ABNORMAL HIGH (ref 78.0–100.0)
PLATELETS: 119 10*3/uL — AB (ref 150–400)
RBC: 3.95 MIL/uL — AB (ref 4.22–5.81)
RDW: 13.6 % (ref 11.5–15.5)
WBC: 11.8 10*3/uL — AB (ref 4.0–10.5)

## 2018-01-06 LAB — GLUCOSE, CAPILLARY
GLUCOSE-CAPILLARY: 119 mg/dL — AB (ref 70–99)
Glucose-Capillary: 102 mg/dL — ABNORMAL HIGH (ref 70–99)
Glucose-Capillary: 119 mg/dL — ABNORMAL HIGH (ref 70–99)
Glucose-Capillary: 88 mg/dL (ref 70–99)
Glucose-Capillary: 88 mg/dL (ref 70–99)

## 2018-01-06 LAB — IRON AND TIBC
IRON: 33 ug/dL — AB (ref 45–182)
Saturation Ratios: 23 % (ref 17.9–39.5)
TIBC: 141 ug/dL — ABNORMAL LOW (ref 250–450)
UIBC: 108 ug/dL

## 2018-01-06 LAB — COMPREHENSIVE METABOLIC PANEL
ALK PHOS: 54 U/L (ref 38–126)
ALT: 13 U/L (ref 0–44)
AST: 23 U/L (ref 15–41)
Albumin: 3 g/dL — ABNORMAL LOW (ref 3.5–5.0)
Anion gap: 12 (ref 5–15)
BILIRUBIN TOTAL: 1 mg/dL (ref 0.3–1.2)
BUN: 9 mg/dL (ref 8–23)
CALCIUM: 8.6 mg/dL — AB (ref 8.9–10.3)
CHLORIDE: 111 mmol/L (ref 98–111)
CO2: 27 mmol/L (ref 22–32)
CREATININE: 0.6 mg/dL — AB (ref 0.61–1.24)
Glucose, Bld: 113 mg/dL — ABNORMAL HIGH (ref 70–99)
Potassium: 3.3 mmol/L — ABNORMAL LOW (ref 3.5–5.1)
Sodium: 150 mmol/L — ABNORMAL HIGH (ref 135–145)
Total Protein: 6.4 g/dL — ABNORMAL LOW (ref 6.5–8.1)

## 2018-01-06 LAB — FERRITIN: Ferritin: 472 ng/mL — ABNORMAL HIGH (ref 24–336)

## 2018-01-06 LAB — TROPONIN I: Troponin I: 0.06 ng/mL (ref <0.03)

## 2018-01-06 LAB — MAGNESIUM: Magnesium: 1.8 mg/dL (ref 1.7–2.4)

## 2018-01-06 LAB — FOLATE: FOLATE: 9.5 ng/mL (ref >5.9)

## 2018-01-06 LAB — PHOSPHORUS: Phosphorus: 2.9 mg/dL (ref 2.5–4.6)

## 2018-01-06 LAB — RETICULOCYTES
RBC.: 3.95 MIL/uL — AB (ref 4.22–5.81)
RETIC COUNT ABSOLUTE: 43.5 10*3/uL (ref 19.0–186.0)
Retic Ct Pct: 1.1 % (ref 0.4–3.1)

## 2018-01-06 LAB — VITAMIN B12: VITAMIN B 12: 1260 pg/mL — AB (ref 180–914)

## 2018-01-06 MED ORDER — POTASSIUM CL IN DEXTROSE 5% 20 MEQ/L IV SOLN
20.0000 meq | INTRAVENOUS | Status: DC
  Administered 2018-01-06 – 2018-01-07 (×3): 20 meq via INTRAVENOUS
  Filled 2018-01-06 (×3): qty 1000

## 2018-01-06 MED ORDER — POTASSIUM CHLORIDE 10 MEQ/100ML IV SOLN
10.0000 meq | INTRAVENOUS | Status: AC
  Administered 2018-01-06 (×4): 10 meq via INTRAVENOUS
  Filled 2018-01-06 (×4): qty 100

## 2018-01-06 NOTE — Progress Notes (Signed)
Patient continues to have episodes of bradycardia. Episodes of bradycardia seem to be in response to low oxygen saturations. Patient saturating fine on RA while awake. When patient goes to sleep, oxygen saturations begin to fall. 2L [place] placed on patient. Patient at 100% currently with HR of 54. Dr. Sharon SellerMcClung paged. Will continue to monitor.

## 2018-01-06 NOTE — Progress Notes (Signed)
Received call from telemetry stating patient's HR went down to the 40's then went into sinus tach followed by a 3.64 second pause then back into sinus rhythm. Paged Dr. Sharon SellerMcClung to notify.

## 2018-01-06 NOTE — Progress Notes (Addendum)
[place] TEAM 1 - Stepdown/ICU TEAM  Aaron Watkins  WUJ:[id number] DOB: 07-Feb-1946 DOA: 01/04/2018 PCP: Mamie Laurelooper, Emanuel, MD    Brief Narrative:  72 y.o. male SNF resident w/ a hx of dementia, stroke, diabetes mellitus, schizophrenia and bipolar disorder who was brought to the ER due to AMS. Patient  is nonverbal at baseline.    In the ER patient was tachycardic with fever of 101.4 F and lactate of 4.2.  CT head was unremarkable. CT abdom noted a right-sided PNA. Labs also noted polycythemia and hypernatremia.  Significant Events: 9/14 admit  Subjective: Pt has has multiple episodes of sinus bradycardia today, w/ HR dipping into the low 40s.  At the time of my visit he remains obtunded.  He will open his eyes to my touch, but will not answer questions.  He does not speak.  He is gurgling on his own oral secretions.  He desaturates when sleeping in front of me.    Assessment & Plan:  SIRS v/s Sepsis  No clear infectious source at this time - CT abdom notes RLL ground glass nodule, not an actual infiltrate - procalcitonin argues against acute pulm infection - now off abx -afebrile - follow clinically   Sinus bradycardia Perhaps related to aricept, or SSS - BP holding steady - stop all possible offending meds - monitor on tele   Hypernatremia Na is improving - cont to provide free water via IV   Recent Labs  Lab 01/05/18 0132 01/05/18 0541 01/05/18 0906 01/06/18 1159  NA 159* 157* 159* 150*      Hypokalemia  Supplement to goal of 4.0 - Mg is normal   Macrocytosis  B12 and folate are not low   Elevated troponin Only mildly elevated, and not in a pattern reflective of ACS  Acute encephalopathy - history of dementia  Likely worsened by DH/hypernatremia - reportedly nonverbal at baseline   Schizophrenia and bipolar disorder on Risperdal and Depakote - holding meds for now due to inability to take orals as well as confusion   DVT prophylaxis: lovenox Code Status: FULL  CODE by default  Family Communication: no family present - called family contact in chart but she was not available  Disposition Plan: SDU  Consultants:  none  Antimicrobials:  Cefepime 9/13 > 9/14 Vanc 9/13  Objective: Blood pressure (!) 119/59, pulse 82, temperature 98 F (36.7 C), temperature source Axillary, resp. rate 12, height 5\' 7"  (1.702 m), weight 70.3 kg, SpO2 100 %.  Intake/Output Summary (Last 24 hours) at 01/06/2018 1406 Last data filed at 01/06/2018 0300 Gross per 24 hour  Intake 1182.38 ml  Output -  Net 1182.38 ml   Filed Weights   01/04/18 2328 01/05/18 1622  Weight: 70.3 kg 70.3 kg    Examination: General: No acute respiratory distress - lethargic  Lungs: Course upper airway sounds th/o all fields - no wheezing  Cardiovascular: bradycardic at 48bpm at time of exam - no M or rub  Abdomen: Nondistended, soft, bowel sounds positive Extremities: No significant cyanosis, clubbing, or edema bilateral lower extremities   CBC: Recent Labs  Lab 01/04/18 2305 01/05/18 0541 01/06/18 1159  WBC 18.1* 17.6* 11.8*  NEUTROABS 12.2* 11.4*  --   HGB 18.8* 13.7 12.8*  HCT 61.7* 46.4 41.6  MCV 105.5* 107.7* 105.3*  PLT 192 141* 119*   Basic Metabolic Panel: Recent Labs  Lab 01/04/18 2305 01/05/18 0132 01/05/18 0541 01/05/18 0906 01/06/18 1159  NA 159* 159* 157* 159* 150*  K 5.3* 4.1  3.1* 3.3* 3.3*  CL 118* 123* 125* 123* 111  CO2 26 27 28 26 27   GLUCOSE 223* 191* 128* 135* 113*  BUN 38* 34* 28* 23 9  CREATININE 1.22 1.13 0.75 0.81 0.60*  CALCIUM 10.1 8.4* 8.2* 8.5* 8.6*  MG  --   --   --   --  1.8  PHOS  --   --   --   --  2.9   GFR: Estimated Creatinine Clearance: 78 mL/min (A) (by C-G formula based on SCr of 0.6 mg/dL (L)).  Liver Function Tests: Recent Labs  Lab 01/04/18 2305 01/05/18 0132 01/05/18 0541 01/06/18 1159  AST 40 31 16 23   ALT 20 16 14 13   ALKPHOS 85 59 54 54  BILITOT 1.3* 1.3* 0.6 1.0  PROT 9.5* 7.0 6.5 6.4*  ALBUMIN 4.2  3.1* 2.8* 3.0*    Coagulation Profile: Recent Labs  Lab 01/05/18 0541  INR 1.26    Cardiac Enzymes: Recent Labs  Lab 01/05/18 0132 01/05/18 0541 01/05/18 0906 01/06/18 1159  CKTOTAL 104  --   --   --   TROPONINI 0.04* 0.04* 0.04* 0.06*    CBG: Recent Labs  Lab 01/05/18 2101 01/06/18 0058 01/06/18 0541 01/06/18 0757 01/06/18 1145  GLUCAP 115* 88 88 119* 119*    Recent Results (from the past 240 hour(s))  Urine culture     Status: None   Collection Time: 01/04/18 11:05 PM  Result Value Ref Range Status   Specimen Description URINE, CATHETERIZED  Final   Special Requests NONE  Final   Culture   Final    NO GROWTH Performed at [facility], [address], [place], [place] [address]    Report Status 01/06/2018 FINAL  Final  Blood Culture (routine x 2)     Status: None (Preliminary result)   Collection Time: 01/04/18 11:17 PM  Result Value Ref Range Status   Specimen Description BLOOD LEFT HAND  Final   Special Requests   Final    BOTTLES DRAWN AEROBIC AND ANAEROBIC Blood Culture adequate volume   Culture   Final    NO GROWTH < 24 HOURS Performed at [facility], [address], [place], [place] [address]    Report Status PENDING  Incomplete  MRSA PCR Screening     Status: None   Collection Time: 01/05/18  4:53 PM  Result Value Ref Range Status   MRSA by PCR NEGATIVE NEGATIVE Final    Comment:        The GeneXpert MRSA Assay (FDA approved for NASAL specimens only), is one component of a comprehensive MRSA colonization surveillance program. It is not intended to diagnose MRSA infection nor to guide or monitor treatment for MRSA infections. Performed at [facility], [address], [place], [place] [address]      Scheduled Meds: . chlorhexidine  15 mL Mouth Rinse BID  . enoxaparin (LOVENOX) injection  40 mg Subcutaneous Daily  . insulin aspart  0-9 Units Subcutaneous Q4H  . mouth rinse  15 mL Mouth Rinse q12n4p   Continuous  Infusions: . sodium chloride Stopped (01/06/18 0122)  . dextrose 5 % with KCl 20 mEq / L 20 mEq (01/06/18 1053)     LOS: 1 day    [name], MD [facility] Office  [phone] Pager - Text Page per Amion as per below:  On-Call/Text Page:      [email or web address]  If 7PM-7AM, please contact night-coverage [email or web address] 01/06/2018, 2:06  PM

## 2018-01-06 NOTE — Evaluation (Signed)
Clinical/Bedside Swallow Evaluation Patient Details  Name: Kyrese Gartman MRN: [id number] Date of Birth: 09-28-1945  Today's Date: 01/06/2018 Time: SLP Start Time (ACUTE ONLY): 1001 SLP Stop Time (ACUTE ONLY): 1023 SLP Time Calculation (min) (ACUTE ONLY): 22 min  Past Medical History:  Past Medical History:  Diagnosis Date  . Abnormal posture   . Acute kidney failure (HCC)   . Bacteremia   . Bipolar 1 disorder (HCC)   . BPH (benign prostatic hyperplasia)   . Convulsions (HCC)   . Dementia   . Diabetes mellitus without complication (HCC)   . Dysphagia   . Dysphagia   . Hypersplenism   . Metabolic encephalopathy   . Schizophrenia (HCC)   . Seizures (HCC)   . Stroke (HCC)   . Thrombocytopenia (HCC)   . Vascular dementia with behavior disturbance   . Vitamin B12 deficiency    Past Surgical History: History reviewed. No pertinent surgical history. HPI:  72 y.o. male with history of dementia, stroke, diabetes mellitus, schizophrenia and bipolar disorder was brought to the ER after patient's provider at the nursing home found the patient was less responsive than usual.  Patient has advanced dementia and stroke and usually is nonverbal.  Since patient was found to be less responsive patient was referred to the ER   Assessment / Plan / Recommendation Clinical Impression   Pt presents with oropharyngeal dysphagia characterized by overt s/s of aspiration including immediate cough with ice chips, delayed cough with nectar-thickened liquids and puree via spoon paired with decreased mentation/LOA and orally, pt held various boluses in oral cavity, decreased oral manipulation/propulsion and a mild-mod delay in the initiation of the swallow; pt has hx of dysphagia; MBS completed 11/18 recommending puree/thin via cup sips, but a repeat study may be warranted while in acute setting if pt mentation does not improve; no family available for BSE; ST will follow acutely for PO readiness/possible  objective assessment prn. SLP Visit Diagnosis: Dysphagia, oropharyngeal phase (R13.12)    Aspiration Risk  Moderate aspiration risk    Diet Recommendation   NPO  Medication Administration: Via alternative means    Other  Recommendations Oral Care Recommendations: Oral care QID   Follow up Recommendations Skilled Nursing facility      Frequency and Duration min 2x/week  1 week       Prognosis Prognosis for Safe Diet Advancement: Fair Barriers to Reach Goals: Cognitive deficits;Severity of deficits      Swallow Study   General Date of Onset: 01/04/18 HPI: 72 y.o. male with history of dementia, stroke, diabetes mellitus, schizophrenia and bipolar disorder was brought to the ER after patient's provider at the nursing home found the patient was less responsive than usual.  Patient has advanced dementia and stroke and usually is nonverbal.  Since patient was found to be less responsive patient was referred to the ER Type of Study: Bedside Swallow Evaluation Previous Swallow Assessment: MBS 03/12/17; Recommend puree diet, given significant delay in swallow reflex on items requiring mastication, as this significantly increases aspiration risk. Recommend thin liquids via CUP SIP ONLY (NO STRAWS). Meds may be given whole, one at a time with liquid Diet Prior to this Study: Information not available Temperature Spikes Noted: No Respiratory Status: Room air History of Recent Intubation: No Behavior/Cognition: Distractible;Requires cueing;Doesn't follow directions;Lethargic/Drowsy Oral Cavity Assessment: Dry(limited d/t mentation) Oral Care Completed by SLP: Other (Comment)(partially d/t pt's mentation; inability to follow directions) Oral Cavity - Dentition: Edentulous Self-Feeding Abilities: Total assist Patient Positioning: Upright in bed  Baseline Vocal Quality: Not observed Volitional Cough: Weak Volitional Swallow: Able to elicit    Oral/Motor/Sensory Function Overall Oral  Motor/Sensory Function: Other (comment)(DTA)   Ice Chips Ice chips: Impaired Presentation: Spoon Oral Phase Impairments: Reduced lingual movement/coordination;Poor awareness of bolus Oral Phase Functional Implications: Prolonged oral transit;Oral holding Pharyngeal Phase Impairments: Cough - Immediate   Thin Liquid Thin Liquid: Not tested Other Comments: (Decreased mentation/inability to follow directives)    Nectar Thick Nectar Thick Liquid: Impaired Presentation: Spoon Oral Phase Impairments: Poor awareness of bolus;Reduced lingual movement/coordination Oral phase functional implications: Prolonged oral transit;Oral holding Pharyngeal Phase Impairments: Suspected delayed Swallow;Multiple swallows;Cough - Delayed   Honey Thick Honey Thick Liquid: Not tested   Puree Puree: Impaired Presentation: Spoon Oral Phase Impairments: Poor awareness of bolus;Reduced lingual movement/coordination Oral Phase Functional Implications: Prolonged oral transit;Oral holding Pharyngeal Phase Impairments: Suspected delayed Swallow;Multiple swallows;Cough - Delayed   Solid     Solid: Not tested      Tressie StalkerPat Chanita Boden, M.S., CCC-SLP 01/06/2018,12:48 PM

## 2018-01-07 LAB — GLUCOSE, CAPILLARY
GLUCOSE-CAPILLARY: 105 mg/dL — AB (ref 70–99)
GLUCOSE-CAPILLARY: 99 mg/dL (ref 70–99)
GLUCOSE-CAPILLARY: 97 mg/dL (ref 70–99)
Glucose-Capillary: 111 mg/dL — ABNORMAL HIGH (ref 70–99)
Glucose-Capillary: 111 mg/dL — ABNORMAL HIGH (ref 70–99)
Glucose-Capillary: 76 mg/dL (ref 70–99)

## 2018-01-07 LAB — BASIC METABOLIC PANEL
ANION GAP: 9 (ref 5–15)
BUN: 6 mg/dL — ABNORMAL LOW (ref 8–23)
CALCIUM: 8.2 mg/dL — AB (ref 8.9–10.3)
CHLORIDE: 111 mmol/L (ref 98–111)
CO2: 27 mmol/L (ref 22–32)
CREATININE: 0.55 mg/dL — AB (ref 0.61–1.24)
GFR calc Af Amer: >60 mL/min (ref >60)
GFR calc non Af Amer: >60 mL/min (ref >60)
Glucose, Bld: 97 mg/dL (ref 70–99)
Potassium: 3.8 mmol/L (ref 3.5–5.1)
Sodium: 147 mmol/L — ABNORMAL HIGH (ref 135–145)

## 2018-01-07 LAB — CBC
HEMATOCRIT: 37.2 % — AB (ref 39.0–52.0)
HEMOGLOBIN: 11.5 g/dL — AB (ref 13.0–17.0)
MCH: 32.2 pg (ref 26.0–34.0)
MCHC: 30.9 g/dL (ref 30.0–36.0)
MCV: 104.2 fL — ABNORMAL HIGH (ref 78.0–100.0)
Platelets: 116 10*3/uL — ABNORMAL LOW (ref 150–400)
RBC: 3.57 MIL/uL — AB (ref 4.22–5.81)
RDW: 13.3 % (ref 11.5–15.5)
WBC: 10 10*3/uL (ref 4.0–10.5)

## 2018-01-07 LAB — TSH: TSH: 0.831 u[IU]/mL (ref 0.350–4.500)

## 2018-01-07 MED ORDER — ACETAMINOPHEN 325 MG PO TABS: 650.0000 mg | ORAL_TABLET | Freq: Four times a day (QID) | ORAL | Status: DC | PRN

## 2018-01-07 MED ORDER — POTASSIUM CL IN DEXTROSE 5% 20 MEQ/L IV SOLN
20.0000 meq | INTRAVENOUS | Status: DC
  Administered 2018-01-07 – 2018-01-08 (×2): 20 meq via INTRAVENOUS
  Filled 2018-01-07 (×2): qty 1000

## 2018-01-07 NOTE — Progress Notes (Addendum)
Aaron Watkins  Aaron Watkins  ZOX:[id number] DOB: Jul 13, 1945 DOA: 01/04/2018 PCP: Aaron Laurel, MD    Brief Narrative:  72 y.o. male SNF resident w/ a hx of dementia, stroke, diabetes mellitus, schizophrenia and bipolar disorder who was brought to the ER due to AMS. Patient is reportedly nonverbal at baseline.    In the ER patient was tachycardic with fever of 101.4 F and lactate of 4.2.  CT head was unremarkable. CT abdom noted a right-sided PNA. Labs also noted polycythemia and hypernatremia.  Significant Events: 9/14 admit  Subjective: Bradycardia appears to be improving. The pt is much improved today.  He is more alert/opens his eyes to my voice.  He is able to say "no" when I ask him if he is hurting, and says "Arianna" when I ask him his name.  He does not respond to the rest of my questions.  There is no evidence of acute resp distress.    Assessment & Plan:  SIRS v/s Sepsis  No clear infectious source at this time - CT abdom notes RLL ground glass nodule, not an actual infiltrate - procalcitonin argues against acute pulm infection - now off abx -afebrile - follow clinically - suspect this simply represented SIRS in the setting of signif dehyration   Sinus bradycardia Perhaps related to aricept, or SSS - BP holding steady - off all possible offending meds - appears to be improving - would not resume aricept   Hypernatremia Indicative of a signif free water deficit/severe dehydration - Na is improving at appropriate rate - cont to provide free water via IV   Recent Labs  Lab 01/05/18 0541 01/05/18 0906 01/06/18 1159 01/07/18 0359  NA 157* 159* 150* 147*      Dypsphagia Did very poorly w/ SLP eval - this is not surprising given his AMS - I am hopefull that this will improve as his mental status continues to improve - he would not be an appropriate candidate for a PEG in my opinion given his apparent severe dementia   Hypokalemia    Supplement to goal of 4.0 - Mg is normal    Macrocytosis  B12 and folate are not low   Elevated troponin Only mildly elevated, and not in a pattern reflective of ACS - f/u in AM   Acute encephalopathy - history of dementia  Likely worsened by DH/hypernatremia - reportedly nonverbal at baseline, but is speaking in very short one word responses today - suspect this may be his baseline   Schizophrenia and bipolar disorder on Risperdal and Depakote - holding meds for now due to inability to take orals as well as confusion   DVT prophylaxis: lovenox Code Status: FULL CODE by default  Family Communication: no family present - called family contact in chart but she was not available  Disposition Plan: stable for tele bed - cont free water via IV - need to cont to watch the dysphagia issue - have thus far not been successful in reaching family to establish GOC - in my opinion we should not consider PEG, and he should be NCB  Consultants:  none  Antimicrobials:  Cefepime 9/13 > 9/14 Vanc 9/13  Objective: Blood pressure (!) 113/59, pulse 79, temperature 98.6 F (37 C), temperature source Axillary, resp. rate 12, height 5\' 7"  (1.702 m), weight 70.3 kg, SpO2 100 %.  Intake/Output Summary (Last 24 hours) at 01/07/2018 1316 Last data filed at 01/07/2018 1200 Gross per 24 hour  Intake 2479.04 ml  Output 1900 ml  Net 579.04 ml   Filed Weights   01/04/18 2328 01/05/18 1622  Weight: 70.3 kg 70.3 kg    Examination: General: No acute respiratory distress - more alert  Lungs: CTA B - no wheezing  Cardiovascular: RRR today w/o M or rub   Abdomen: NT/ND, sfot, bs+, no mass  Extremities: No significant edema B LE    CBC: Recent Labs  Lab 01/04/18 2305 01/05/18 0541 01/06/18 1159 01/07/18 0643  WBC 18.1* 17.6* 11.8* 10.0  NEUTROABS 12.2* 11.4*  --   --   HGB 18.8* 13.7 12.8* 11.5*  HCT 61.7* 46.4 41.6 37.2*  MCV 105.5* 107.7* 105.3* 104.2*  PLT 192 141* 119* 116*   Basic  Metabolic Panel: Recent Labs  Lab 01/05/18 0132 01/05/18 0541 01/05/18 0906 01/06/18 1159 01/07/18 0359  NA 159* 157* 159* 150* 147*  K 4.1 3.1* 3.3* 3.3* 3.8  CL 123* 125* 123* 111 111  CO2 27 28 26 27 27   GLUCOSE 191* 128* 135* 113* 97  BUN 34* 28* 23 9 6*  CREATININE 1.13 0.75 0.81 0.60* 0.55*  CALCIUM 8.4* 8.2* 8.5* 8.6* 8.2*  MG  --   --   --  1.8  --   PHOS  --   --   --  2.9  --    GFR: Estimated Creatinine Clearance: 78 mL/min (A) (by C-G formula based on SCr of 0.55 mg/dL (L)).  Liver Function Tests: Recent Labs  Lab 01/04/18 2305 01/05/18 0132 01/05/18 0541 01/06/18 1159  AST 40 31 16 23   ALT 20 16 14 13   ALKPHOS 85 59 54 54  BILITOT 1.3* 1.3* 0.6 1.0  PROT 9.5* 7.0 6.5 6.4*  ALBUMIN 4.2 3.1* 2.8* 3.0*    Coagulation Profile: Recent Labs  Lab 01/05/18 0541  INR 1.26    Cardiac Enzymes: Recent Labs  Lab 01/05/18 0132 01/05/18 0541 01/05/18 0906 01/06/18 1159  CKTOTAL 104  --   --   --   TROPONINI 0.04* 0.04* 0.04* 0.06*    CBG: Recent Labs  Lab 01/06/18 2010 01/07/18 0023 01/07/18 0400 01/07/18 0806 01/07/18 1236  GLUCAP 105* 111* 99 97 111*    Recent Results (from the past 240 hour(s))  Urine culture     Status: None   Collection Time: 01/04/18 11:05 PM  Result Value Ref Range Status   Specimen Description URINE, CATHETERIZED  Final   Special Requests NONE  Final   Culture   Final    NO GROWTH Performed at [facility], [address] [place], [place] [address]    Report Status 01/06/2018 FINAL  Final  Blood Culture (routine x 2)     Status: None (Preliminary result)   Collection Time: 01/04/18 11:17 PM  Result Value Ref Range Status   Specimen Description BLOOD LEFT HAND  Final   Special Requests   Final    BOTTLES DRAWN AEROBIC AND ANAEROBIC Blood Culture adequate volume   Culture   Final    NO GROWTH 3 DAYS Performed at [facility], [address] [place], [place] [address]    Report Status PENDING   Incomplete  Blood Culture (routine x 2)     Status: None (Preliminary result)   Collection Time: 01/04/18 11:30 PM  Result Value Ref Range Status   Specimen Description BLOOD RIGHT HAND  Final   Special Requests   Final    BOTTLES DRAWN AEROBIC AND ANAEROBIC Blood Culture adequate volume   Culture  Final    NO GROWTH 2 DAYS Performed at [facility], [address] [place], [place] [address]    Report Status PENDING  Incomplete  MRSA PCR Screening     Status: None   Collection Time: 01/05/18  4:53 PM  Result Value Ref Range Status   MRSA by PCR NEGATIVE NEGATIVE Final    Comment:        The GeneXpert MRSA Assay (FDA approved for NASAL specimens only), is one component of a comprehensive MRSA colonization surveillance program. It is not intended to diagnose MRSA infection nor to guide or monitor treatment for MRSA infections. Performed at [facility], [address] [place], [place] [address]      Scheduled Meds: . chlorhexidine  15 mL Mouth Rinse BID  . enoxaparin (LOVENOX) injection  40 mg Subcutaneous Daily  . insulin aspart  0-9 Units Subcutaneous Q4H  . mouth rinse  15 mL Mouth Rinse q12n4p   Continuous Infusions: . dextrose 5 % with KCl 20 mEq / L 20 mEq (01/07/18 0628)     LOS: 2 days   Lonia BloodJeffrey T. Aicia Babinski, MD [facility] Office  [phone] Pager - Text Page per Loretha StaplerAmion as per below:  On-Call/Text Page:      [email or web address]  If 7PM-7AM, please contact night-coverage [email or web address] 01/07/2018, 1:16 PM

## 2018-01-07 NOTE — Progress Notes (Signed)
Pt  Insulin on hold due to agitation.

## 2018-01-07 NOTE — Progress Notes (Signed)
  Speech Language Pathology Treatment: Dysphagia  Patient Details Name: Aaron Watkins MRN: [id number] DOB: [birth year] Today's Date: 01/07/2018 Time: [id number] SLP Time Calculation (min) (ACUTE ONLY): 12 min  Assessment / Plan / Recommendation Clinical Impression  Pt is alert this morning and without audible secretions as was documented in MD note on previous date. He does not vocalize though, therefore unable to completely assess vocal quality. Pt has intermittent coughing associated with thin liquids and cognitively cannot complete a 3 oz water challenge without stopping. No overt signs of aspiration are observed with purees, although his awareness and sustained attention remain reduced. Recommend starting meds crushed in puree, but holding other POs. SLP to f/u for possible diet initiation pending further improvements in mentation.   HPI HPI: 72 y.o. male with history of dementia, stroke, diabetes mellitus, schizophrenia and bipolar disorder was brought to the ER after patient's provider at the nursing home found the patient was less responsive than usual.  Patient has advanced dementia and stroke and usually is nonverbal.  Since patient was found to be less responsive patient was referred to the ER      SLP Plan  Continue with current plan of care       Recommendations  Diet recommendations: NPO Medication Administration: Crushed with puree                Oral Care Recommendations: Oral care QID Follow up Recommendations: Skilled Nursing facility SLP Visit Diagnosis: Dysphagia, unspecified (R13.10) Plan: Continue with current plan of care       GO                Maxcine Hamaiewonsky, Shruthi Northrup 01/07/2018, 11:55 AM  Maxcine HamLaura Paiewonsky, M.A. CCC-SLP Acute Herbalistehabilitation Services Pager [phone] Office [phone]

## 2018-01-07 NOTE — NC FL2 (Signed)
[place] MEDICAID FL2 LEVEL OF CARE SCREENING TOOL     IDENTIFICATION  Patient Name: Aaron Watkins Birthdate: 1945/10/31 Sex: male Admission Date (Current Location): 01/04/2018  [facility] and [place] Number:  Producer, television/film/video and Address:  [facility] [place], [address] [place], [place] [address]      Provider Number: [id number]  Attending Physician Name and Address:  [name], MD  Relative Name and Phone Number:  Burdett Pinzon (sister)    Current Level of Care: Hospital Recommended Level of Care: Skilled Nursing Facility Prior Approval Number:    Date Approved/Denied:   PASRR Number: [id number]  Discharge Plan: SNF    Current Diagnoses: Patient Active Problem List   Diagnosis Date Noted  . Sepsis (HCC) 01/05/2018  . Acute encephalopathy 01/05/2018  . Hypernatremia 01/05/2018  . Diabetes mellitus type 2 in nonobese (HCC) 01/05/2018  . Schizophrenia (HCC) 01/05/2018  . Dementia 01/05/2018  . History of completed stroke 01/05/2018    Orientation RESPIRATION BLADDER Height & Weight     Self  Normal Incontinent Weight: 154 lb 15.7 oz (70.3 kg) Height:  5\' 7"  (170.2 cm)  BEHAVIORAL SYMPTOMS/MOOD NEUROLOGICAL BOWEL NUTRITION STATUS      Incontinent Diet(See discharge summary)  AMBULATORY STATUS COMMUNICATION OF NEEDS Skin   Extensive Assist Verbally Normal                       Personal Care Assistance Level of Assistance  Bathing, Feeding, Dressing, Total care Bathing Assistance: Maximum assistance Feeding assistance: Maximum assistance Dressing Assistance: Maximum assistance Total Care Assistance: Maximum assistance   Functional Limitations Info  Sight, Hearing, Speech Sight Info: Adequate Hearing Info: Adequate Speech Info: Impaired    SPECIAL CARE FACTORS FREQUENCY  PT (By licensed PT), OT (By licensed OT)     PT Frequency: 3x weekly OT Frequency: 3x weekly            Contractures Contractures Info:  Not present    Additional Factors Info  Code Status, Allergies Code Status Info: full Allergies Info: no known           Current Medications (01/07/2018):  This is the current hospital active medication list Current Facility-Administered Medications  Medication Dose Route Frequency Provider Last Rate Last Dose  . acetaminophen (TYLENOL) tablet 650 mg  650 mg Oral Q6H PRN [name], MD      . chlorhexidine (PERIDEX) 0.12 % solution 15 mL  15 mL Mouth Rinse BID Eduard Clos, MD   15 mL at 01/07/18 1417  . dextrose 5 % with KCl 20 mEq / L  infusion  20 mEq Intravenous Continuous Jetty Duhamel T, MD      . enoxaparin (LOVENOX) injection 40 mg  40 mg Subcutaneous Daily Eduard Clos, MD   40 mg at 01/07/18 1417  . insulin aspart (novoLOG) injection 0-9 Units  0-9 Units Subcutaneous Q4H Eduard Clos, MD   Stopped at 01/07/18 1402  . ipratropium-albuterol (DUONEB) 0.5-2.5 (3) MG/3ML nebulizer solution 3 mL  3 mL Nebulization Q2H PRN [name], MD      . MEDLINE mouth rinse  15 mL Mouth Rinse q12n4p Eduard Clos, MD   15 mL at 01/07/18 1418  . ondansetron (ZOFRAN) injection 4 mg  4 mg Intravenous Q6H PRN Eduard Clos, MD         Discharge Medications: Please see discharge summary for a list of discharge medications.  Relevant  Imaging Results:  Relevant Lab Results:   Additional Information SSN: [id number]  Gildardo GriffesAshley M Zarinah Oviatt, LCSW

## 2018-01-08 LAB — GLUCOSE, CAPILLARY
GLUCOSE-CAPILLARY: 82 mg/dL (ref 70–99)
GLUCOSE-CAPILLARY: 82 mg/dL (ref 70–99)
Glucose-Capillary: 79 mg/dL (ref 70–99)
Glucose-Capillary: 92 mg/dL (ref 70–99)
Glucose-Capillary: 92 mg/dL (ref 70–99)
Glucose-Capillary: 97 mg/dL (ref 70–99)

## 2018-01-08 LAB — BASIC METABOLIC PANEL
ANION GAP: 11 (ref 5–15)
BUN: <5 mg/dL — ABNORMAL LOW (ref 8–23)
CALCIUM: 8.4 mg/dL — AB (ref 8.9–10.3)
CO2: 24 mmol/L (ref 22–32)
Chloride: 108 mmol/L (ref 98–111)
Creatinine, Ser: 0.59 mg/dL — ABNORMAL LOW (ref 0.61–1.24)
Glucose, Bld: 91 mg/dL (ref 70–99)
Potassium: 3.6 mmol/L (ref 3.5–5.1)
Sodium: 143 mmol/L (ref 135–145)

## 2018-01-08 LAB — CBC
HCT: 38.5 % — ABNORMAL LOW (ref 39.0–52.0)
Hemoglobin: 12 g/dL — ABNORMAL LOW (ref 13.0–17.0)
MCH: 31.8 pg (ref 26.0–34.0)
MCHC: 31.2 g/dL (ref 30.0–36.0)
MCV: 102.1 fL — AB (ref 78.0–100.0)
PLATELETS: 117 10*3/uL — AB (ref 150–400)
RBC: 3.77 MIL/uL — AB (ref 4.22–5.81)
RDW: 13.1 % (ref 11.5–15.5)
WBC: 8.1 10*3/uL (ref 4.0–10.5)

## 2018-01-08 LAB — TROPONIN I: Troponin I: 0.03 ng/mL (ref <0.03)

## 2018-01-08 MED ORDER — POTASSIUM CL IN DEXTROSE 5% 20 MEQ/L IV SOLN
20.0000 meq | INTRAVENOUS | Status: DC
  Administered 2018-01-08 (×2): 20 meq via INTRAVENOUS
  Filled 2018-01-08 (×3): qty 1000

## 2018-01-08 NOTE — Care Management Note (Signed)
Case Management Note  Patient Details  Name: Aaron Watkins MRN: [id number] Date of Birth: 1945-05-09  Subjective/Objective:                Sepsis/SIRS/AMS. From SNF/ [facility].  Guy SandiferBertha Fadeley (Sister)     [phone]      PCP: Lyndal RainbowEmmanuel [name]  Action/Plan: Transition back to SNF when medically stable...CSW aware and following. NCM will continue to monitor for transitional care needs.  Expected Discharge Date:                  Expected Discharge Plan:  Skilled Nursing Facility  In-House Referral:  Clinical Social Work  Discharge planning Services  CM Consult  Post Acute Care Choice:    Choice offered to:     DME Arranged:    DME Agency:     HH Arranged:    HH Agency:     Status of Service:  In process, will continue to follow  If discussed at Long Length of Stay Meetings, dates discussed:    Additional Comments:  Epifanio LeschesCole, Darlynn Ricco Hudson, RN 01/08/2018, 9:22 AM

## 2018-01-08 NOTE — Progress Notes (Signed)
[place] TEAM 1 - Stepdown/ICU TEAM  Aaron Watkins  ZOX:[id number] DOB: April 25, 1945 DOA: 01/04/2018 PCP: Mamie Laurel, MD    Brief Narrative:  72 y.o. male SNF resident w/ a hx of dementia, stroke, diabetes mellitus, schizophrenia and bipolar disorder who was brought to the ER due to AMS. Patient is reportedly nonverbal at baseline.    In the ER patient was tachycardic with fever of 101.4 F and lactate of 4.2.  CT head was unremarkable. CT abdom noted a right-sided PNA. Labs also noted polycythemia and hypernatremia.  He failed swallow evaluation couple times, he passed evaluation today, on dysphagia 1 with thickened liquids.  No source of infection could be found, remains afebrile off antibiotics.  Significant Events: 9/14 admit  Subjective: Patient only answer his name once asked, cannot provide any complaints, no significant events overnight per staff, he failed swallow evaluation yesterday, but he was advanced to dysphagia 1 with thin liquid today  Assessment & Plan:  SIRS v/s Sepsis  No clear infectious source at this time - CT abdom notes RLL ground glass nodule, not an actual infiltrate - procalcitonin argues against acute pulm infection - now off abx -afebrile - follow clinically - suspect this simply represented SIRS in the setting of signif dehyration   Sinus bradycardia Perhaps related to aricept, or SSS - BP holding steady - off all possible offending meds - appears to be improving, no further bradycardia,- would not resume aricept   Hypernatremia Indicative of a signif free water deficit/severe dehydration -159 on admission, significantly improving, it is 143 today, I will decrease his D5 W from 75 to 50 cc/h .  Recent Labs  Lab 01/05/18 0906 01/06/18 1159 01/07/18 0359 01/08/18 0956  NA 159* 150* 147* 143      Dypsphagia Did very poorly w/ SLP eval - this is not surprising given his AMS -mental status slightly improved today, he did pass his swallow  evaluation, currently on dysphagia 1 with thin liquid  Hypokalemia  Supplement to goal of 4.0 - Mg is normal    Macrocytosis  B12 and folate are not low   Elevated troponin Only mildly elevated, and not in a pattern reflective of ACS - f/u in AM   Acute encephalopathy - history of dementia  Likely worsened by DH/hypernatremia - reportedly nonverbal at baseline, but is speaking in very short one word responses today - suspect this may be his baseline   Schizophrenia and bipolar disorder on Risperdal and Depakote -resume when more stable  DVT prophylaxis: lovenox Code Status: FULL CODE by default  Family Communication: no family present  Disposition Plan: back To SNF in a.m. if with oral intake, and IV fluids can be discontinued  Consultants:  none  Antimicrobials:  Cefepime 9/13 > 9/14 Vanc 9/13  Objective: Blood pressure (!) 106/55, pulse 84, temperature 98.1 F (36.7 C), temperature source Oral, resp. rate 18, height 5\' 7"  (1.702 m), weight 70.3 kg, SpO2 100 %.  Intake/Output Summary (Last 24 hours) at 01/08/2018 1316 Last data filed at 01/08/2018 0900 Gross per 24 hour  Intake 740.06 ml  Output 675 ml  Net 65.06 ml   Filed Weights   01/04/18 2328 01/05/18 1622  Weight: 70.3 kg 70.3 kg    Examination:  Awake , nonverbal, minimally communicative  symmetrical Chest wall movement, Good air movement bilaterally, CTAB RRR,No Gallops,Rubs or new Murmurs, No Parasternal Heave +ve B.Sounds, Abd Soft, No tenderness, No rebound - guarding or rigidity. No Cyanosis, Clubbing or edema, No  new Rash or bruise       CBC: Recent Labs  Lab 01/04/18 2305 01/05/18 0541 01/06/18 1159 01/07/18 0643 01/08/18 0956  WBC 18.1* 17.6* 11.8* 10.0 8.1  NEUTROABS 12.2* 11.4*  --   --   --   HGB 18.8* 13.7 12.8* 11.5* 12.0*  HCT 61.7* 46.4 41.6 37.2* 38.5*  MCV 105.5* 107.7* 105.3* 104.2* 102.1*  PLT 192 141* 119* 116* 117*   Basic Metabolic Panel: Recent Labs  Lab  01/05/18 0541 01/05/18 0906 01/06/18 1159 01/07/18 0359 01/08/18 0956  NA 157* 159* 150* 147* 143  K 3.1* 3.3* 3.3* 3.8 3.6  CL 125* 123* 111 111 108  CO2 28 26 27 27 24   GLUCOSE 128* 135* 113* 97 91  BUN 28* 23 9 6* <5*  CREATININE 0.75 0.81 0.60* 0.55* 0.59*  CALCIUM 8.2* 8.5* 8.6* 8.2* 8.4*  MG  --   --  1.8  --   --   PHOS  --   --  2.9  --   --    GFR: Estimated Creatinine Clearance: 78 mL/min (A) (by C-G formula based on SCr of 0.59 mg/dL (L)).  Liver Function Tests: Recent Labs  Lab 01/04/18 2305 01/05/18 0132 01/05/18 0541 01/06/18 1159  AST 40 31 16 23   ALT 20 16 14 13   ALKPHOS 85 59 54 54  BILITOT 1.3* 1.3* 0.6 1.0  PROT 9.5* 7.0 6.5 6.4*  ALBUMIN 4.2 3.1* 2.8* 3.0*    Coagulation Profile: Recent Labs  Lab 01/05/18 0541  INR 1.26    Cardiac Enzymes: Recent Labs  Lab 01/05/18 0132 01/05/18 0541 01/05/18 0906 01/06/18 1159 01/08/18 0956  CKTOTAL 104  --   --   --   --   TROPONINI 0.04* 0.04* 0.04* 0.06* 0.03*    CBG: Recent Labs  Lab 01/07/18 2051 01/08/18 0039 01/08/18 0521 01/08/18 0820 01/08/18 1141  GLUCAP 76 79 92 82 82    Recent Results (from the past 240 hour(s))  Urine culture     Status: None   Collection Time: 01/04/18 11:05 PM  Result Value Ref Range Status   Specimen Description URINE, CATHETERIZED  Final   Special Requests NONE  Final   Culture   Final    NO GROWTH Performed at  [facility], [address] [place], [place] [address]    Report Status 01/06/2018 FINAL  Final  Blood Culture (routine x 2)     Status: None (Preliminary result)   Collection Time: 01/04/18 11:17 PM  Result Value Ref Range Status   Specimen Description BLOOD LEFT HAND  Final   Special Requests   Final    BOTTLES DRAWN AEROBIC AND ANAEROBIC Blood Culture adequate volume   Culture   Final    NO GROWTH 4 DAYS Performed at [facility], [address], [place], [place] [address]    Report Status PENDING  Incomplete  Blood  Culture (routine x 2)     Status: None (Preliminary result)   Collection Time: 01/04/18 11:30 PM  Result Value Ref Range Status   Specimen Description BLOOD RIGHT HAND  Final   Special Requests   Final    BOTTLES DRAWN AEROBIC AND ANAEROBIC Blood Culture adequate volume   Culture   Final    NO GROWTH 3 DAYS Performed at [facility], [address] [place], [place] [address]    Report Status PENDING  Incomplete  MRSA PCR Screening     Status: None  Collection Time: 01/05/18  4:53 PM  Result Value Ref Range Status   MRSA by PCR NEGATIVE NEGATIVE Final    Comment:        The GeneXpert MRSA Assay (FDA approved for NASAL specimens only), is one component of a comprehensive MRSA colonization surveillance program. It is not intended to diagnose MRSA infection nor to guide or monitor treatment for MRSA infections. Performed at [facility], [address] [place], [place] [id number]      Scheduled Meds: . chlorhexidine  15 mL Mouth Rinse BID  . enoxaparin (LOVENOX) injection  40 mg Subcutaneous Daily  . insulin aspart  0-9 Units Subcutaneous Q4H  . mouth rinse  15 mL Mouth Rinse q12n4p   Continuous Infusions: . dextrose 5 % with KCl 20 mEq / L 20 mEq (01/08/18 [id number])     LOS: 3 days   Huey Bienenstockdawood Elgergawy, MD [facility] Office  [phone] Pager - Text Page per Loretha StaplerAmion as per below:  On-Call/Text Page:      [email or web address]  If 7PM-7AM, please contact night-coverage [email or web address] 01/08/2018, 1:16 PM

## 2018-01-08 NOTE — Progress Notes (Signed)
  Speech Language Pathology Treatment: Dysphagia  Patient Details Name: Aaron Watkins MRN: [id number] DOB: 02-12-1946 Today's Date: 01/08/2018 Time: [id number] SLP Time Calculation (min) (ACUTE ONLY): 10 min  Assessment / Plan / Recommendation Clinical Impression  Pt is alert and has improved bolus awareness today. His presentation seems consistent with prior MBS, at which time he had slow oral transit/preparation. He did also have an episode of aspiration, which elicited a spontaneous cough. Today pt has no coughing, and he clears his oral cavity well given extra time. Min A given for oral acceptance. He has multiple swallows, but per prior MBS this helped clear pharyngeal residue. Recommend Dys 1 diet, thin liquids. SLP will f/u for tolerance.   HPI HPI: 72 y.o. male with history of dementia, stroke, diabetes mellitus, schizophrenia and bipolar disorder was brought to the ER after patient's provider at the nursing home found the patient was less responsive than usual.  Patient has advanced dementia and stroke and usually is nonverbal.  Since patient was found to be less responsive patient was referred to the ER      SLP Plan  Continue with current plan of care       Recommendations  Diet recommendations: Dysphagia 1 (puree);Thin liquid Liquids provided via: Cup;Straw Medication Administration: Whole meds with liquid Supervision: Staff to assist with self feeding;Full supervision/cueing for compensatory strategies Compensations: Slow rate;Small sips/bites;Multiple dry swallows after each bite/sip Postural Changes and/or Swallow Maneuvers: Seated upright 90 degrees                Oral Care Recommendations: Oral care BID Follow up Recommendations: Skilled Nursing facility SLP Visit Diagnosis: Dysphagia, unspecified (R13.10) Plan: Continue with current plan of care       GO                Maxcine Hamaiewonsky, Jacqueline Spofford 01/08/2018, 11:27 AM  Maxcine HamLaura Paiewonsky, M.A. CCC-SLP Acute  Herbalistehabilitation Services Pager [phone] Office [phone]

## 2018-01-09 ENCOUNTER — Inpatient Hospital Stay (HOSPITAL_COMMUNITY): Payer: Medicare Other

## 2018-01-09 DIAGNOSIS — E119 Type 2 diabetes mellitus without complications: Secondary | ICD-10-CM

## 2018-01-09 DIAGNOSIS — G9340 Encephalopathy, unspecified: Secondary | ICD-10-CM

## 2018-01-09 DIAGNOSIS — A419 Sepsis, unspecified organism: Principal | ICD-10-CM

## 2018-01-09 DIAGNOSIS — F201 Disorganized schizophrenia: Secondary | ICD-10-CM

## 2018-01-09 LAB — MAGNESIUM: Magnesium: 1.8 mg/dL (ref 1.7–2.4)

## 2018-01-09 LAB — BASIC METABOLIC PANEL
Anion gap: 10 (ref 5–15)
BUN: <5 mg/dL — ABNORMAL LOW (ref 8–23)
CALCIUM: 8.5 mg/dL — AB (ref 8.9–10.3)
CO2: 27 mmol/L (ref 22–32)
Chloride: 104 mmol/L (ref 98–111)
Creatinine, Ser: 0.67 mg/dL (ref 0.61–1.24)
GFR calc Af Amer: >60 mL/min (ref >60)
GFR calc non Af Amer: >60 mL/min (ref >60)
GLUCOSE: 95 mg/dL (ref 70–99)
Potassium: 3.4 mmol/L — ABNORMAL LOW (ref 3.5–5.1)
Sodium: 141 mmol/L (ref 135–145)

## 2018-01-09 LAB — BLOOD GAS, ARTERIAL
Acid-Base Excess: 3.1 mmol/L — ABNORMAL HIGH (ref 0.0–2.0)
BICARBONATE: 26.4 mmol/L (ref 20.0–28.0)
Drawn by: 535271
O2 CONTENT: 21 L/min
O2 Saturation: 96.3 %
PH ART: 7.493 — AB (ref 7.350–7.450)
PO2 ART: 76.1 mmHg — AB (ref 83.0–108.0)
Patient temperature: 97.4
pCO2 arterial: 34.5 mmHg (ref 32.0–48.0)

## 2018-01-09 LAB — GLUCOSE, CAPILLARY
GLUCOSE-CAPILLARY: 107 mg/dL — AB (ref 70–99)
GLUCOSE-CAPILLARY: 111 mg/dL — AB (ref 70–99)
GLUCOSE-CAPILLARY: 88 mg/dL (ref 70–99)
GLUCOSE-CAPILLARY: 90 mg/dL (ref 70–99)
Glucose-Capillary: 105 mg/dL — ABNORMAL HIGH (ref 70–99)
Glucose-Capillary: 79 mg/dL (ref 70–99)
Glucose-Capillary: 99 mg/dL (ref 70–99)

## 2018-01-09 LAB — CULTURE, BLOOD (ROUTINE X 2)
CULTURE: NO GROWTH
SPECIAL REQUESTS: ADEQUATE

## 2018-01-09 LAB — AMMONIA: Ammonia: 35 umol/L (ref 9–35)

## 2018-01-09 LAB — PHOSPHORUS: Phosphorus: 1.4 mg/dL — ABNORMAL LOW (ref 2.5–4.6)

## 2018-01-09 LAB — TSH: TSH: 0.909 u[IU]/mL (ref 0.350–4.500)

## 2018-01-09 MED ORDER — POTASSIUM CHLORIDE CRYS ER 20 MEQ PO TBCR: 40.0000 meq | EXTENDED_RELEASE_TABLET | Freq: Once | ORAL | Status: DC

## 2018-01-09 MED ORDER — PRO-STAT SUGAR FREE PO LIQD
30.0000 mL | Freq: Two times a day (BID) | ORAL | Status: DC
  Administered 2018-01-10: 30 mL via ORAL
  Filled 2018-01-09: qty 30

## 2018-01-09 MED ORDER — AMLODIPINE BESYLATE 10 MG PO TABS
10.0000 mg | ORAL_TABLET | Freq: Every day | ORAL | Status: DC
  Filled 2018-01-09: qty 1

## 2018-01-09 MED ORDER — POTASSIUM CHLORIDE 10 MEQ/100ML IV SOLN
10.0000 meq | INTRAVENOUS | Status: AC
  Administered 2018-01-09 (×2): 10 meq via INTRAVENOUS
  Filled 2018-01-09 (×2): qty 100

## 2018-01-09 MED ORDER — HYDRALAZINE HCL 25 MG PO TABS
25.0000 mg | ORAL_TABLET | Freq: Three times a day (TID) | ORAL | Status: DC
  Filled 2018-01-09: qty 1

## 2018-01-09 MED ORDER — SODIUM PHOSPHATES 45 MMOLE/15ML IV SOLN
30.0000 mmol | Freq: Once | INTRAVENOUS | Status: AC
  Administered 2018-01-09: 30 mmol via INTRAVENOUS
  Filled 2018-01-09: qty 10

## 2018-01-09 MED ORDER — HYDRALAZINE HCL 20 MG/ML IJ SOLN: 10.0000 mg | Freq: Four times a day (QID) | INTRAMUSCULAR | Status: DC | PRN

## 2018-01-09 NOTE — Progress Notes (Signed)
Palliative Medicine consult noted. Due to high referral volume, there may be a delay seeing this patient. Please call the Palliative Medicine Team office at [phone] if recommendations are needed in the interim.  Thank you for inviting us to see this patient.  Margret ChanceMelanie G. Carrie Schoonmaker, RN, BSN, [facility] Palliative Medicine Team 01/09/2018 9:02 AM Office [phone]

## 2018-01-09 NOTE — Progress Notes (Signed)
PT Cancellation Note  Patient Details Name: Aaron Watkins MRN: [id number] DOB: 1945/06/10   Cancelled Treatment:    Reason Eval/Treat Not Completed: Patient not medically ready. After discussion with RN, [name], pt not appropriate for PT evaluation. Obtunded and responding to painful stimuli only. Will follow.  [name], PT, DPT Acute Rehabilitation Services Pager [phone] Office [phone]    [name] [name] 01/09/2018, 2:39 PM

## 2018-01-09 NOTE — Progress Notes (Signed)
Pt appears to be more lethargic today than yesterday. Pt not following commands and only responds to painful stimuli. Would not eat breakfast or drink anything. Concerns and observations relayed to Dr. Thedore MinsSingh and orders placed. Will continue to monitor.

## 2018-01-09 NOTE — Progress Notes (Signed)
Attempted to feed pt lunch but is too lethargic to take in anything by mouth. Pt will open eyes but does not stay awake enough for any PO intake. Pt unable to clear secretions and attempts made to suction out. Pt responds to painful stimuli and occasional verbal stimuli but does not follow commands. Will continue to monitor.

## 2018-01-09 NOTE — Progress Notes (Addendum)
[name] TEAM 1 - Stepdown/ICU TEAM  Aaron Watkins  ZOX:[id number] DOB: 1945-06-29 DOA: 01/04/2018 PCP: Mamie Laurelooper, Emanuel, MD    Brief Narrative: 72 y.o. male SNF resident w/ a hx of dementia, stroke, diabetes mellitus, schizophrenia and bipolar disorder who was brought to the ER due to AMS. Patient is reportedly nonverbal at baseline.    In the ER patient was tachycardic with fever of 101.4 F and lactate of 4.2.  CT head was unremarkable. CT abdom noted a right-sided PNA. Labs also noted polycythemia and hypernatremia.  He failed swallow evaluation couple times, he passed evaluation today, on dysphagia 1 with thickened liquids.  No source of infection could be found, remains afebrile off antibiotics.  Significant Events: 9/14 admit  Subjective:  Patient in bed, no distress but does not answer questions, nods his head, unreliable historian but denies any headache chest or abdominal pain.   Assessment & Plan:  SIRS v/s Sepsis due to aspiration pneumonia, recurrent dysphagia.  This is a chronic problem.  She is seen by speech therapist, placed on dysphagia 1 diet with feeding assistance and aspiration precautions, has finished antibiotic therapy and sepsis physiology has resolved, all cultures are negative, still appears to be having a tough time controlling his oral secretions, long-term prognosis is poor, discussed with sister who has poor understanding of patient's disease and morbidity, will have palliative care also get involved for goals of care.  For now full code.   Hypertension.  Will add Norvasc and hydralazine monitor.    Sinus bradycardia  Perhaps related to aricept, hold Aricept upon discharge.  Hypernatremia - resolved after hydration with D5W.   Hypokalemia, hypophosphatemia, hypomagnesemia.  All replaced.   Macrocytosis  - B12 and folate are not low, stable TSH  Elevated troponin - trend was flat and and non-ACS pattern, likely due to demand ischemia from sinus  tachycardia when he was admitted in septic.   Acute Toxic/Metabolicencephalopathy - history of dementia  - unclear baseline, supportive care, minimize narcotics and benzodiazepines.  Remains at risk for delirium.  Have reviewed notes from previous to physicians and discussed his case with the previous 2 physicians, patient was nearly obtunded almost throughout his hospital stay with minimal responsiveness.  Head CT was negative, he moves all 4 extremities to painful stimuli, has chronic right-sided facial droop, ammonia and ABG are unremarkable as well.  Schizophrenia and bipolar disorder - on Risperdal and Depakote -resume when more stable  R. facial drrop - told it is old by the staff.    Lab Results  Component Value Date   TSH 0.831 01/07/2018     DVT prophylaxis: lovenox Code Status: FULL CODE by default  Family Communication: Cussed with sister in detail over the phone, she lives in [place] and would like to get the patient closer to her home. Disposition Plan: back To SNF when better  Consultants: none  Anti-infectives (From admission, onward)   Start     Dose/Rate Route Frequency Ordered Stop   01/05/18 1000  ceFEPIme (MAXIPIME) 1 g in sodium chloride 0.9 % 100 mL IVPB  Status:  Discontinued     1 g 200 mL/hr over 30 Minutes Intravenous Every 12 hours 01/05/18 0608 01/05/18 1048   01/05/18 0800  vancomycin (VANCOCIN) IVPB 750 mg/150 ml premix  Status:  Discontinued     750 mg 150 mL/hr over 60 Minutes Intravenous Every 12 hours 01/05/18 0608 01/05/18 1043   01/04/18 2315  ceFEPIme (MAXIPIME) 2 g in sodium chloride 0.9 %  100 mL IVPB     2 g 200 mL/hr over 30 Minutes Intravenous  Once 01/04/18 2312 01/05/18 0001   01/04/18 2315  metroNIDAZOLE (FLAGYL) IVPB 500 mg  Status:  Discontinued     500 mg 100 mL/hr over 60 Minutes Intravenous Every 8 hours 01/04/18 2312 01/05/18 1109   01/04/18 2315  vancomycin (VANCOCIN) IVPB 1000 mg/200 mL premix     1,000 mg 200 mL/hr over  60 Minutes Intravenous  Once 01/04/18 2312 01/05/18 0147     Scheduled Meds: . chlorhexidine  15 mL Mouth Rinse BID  . enoxaparin (LOVENOX) injection  40 mg Subcutaneous Daily  . insulin aspart  0-9 Units Subcutaneous Q4H  . mouth rinse  15 mL Mouth Rinse q12n4p   Continuous Infusions: . potassium chloride 10 mEq (01/09/18 0958)  . sodium phosphate  Dextrose 5% IVPB     PRN Meds:.acetaminophen, ipratropium-albuterol, [DISCONTINUED] ondansetron **OR** ondansetron (ZOFRAN) IV   Objective: Blood pressure (!) 178/87, pulse 90, temperature (!) 97.4 F (36.3 C), temperature source Oral, resp. rate 18, height 5\' 7"  (1.702 m), weight 72.1 kg, SpO2 97 %.  Intake/Output Summary (Last 24 hours) at 01/09/2018 1009 Last data filed at 01/08/2018 1852 Gross per 24 hour  Intake 1252.22 ml  Output 450 ml  Net 802.22 ml   Filed Weights   01/04/18 2328 01/05/18 1622 01/09/18 0409  Weight: 70.3 kg 70.3 kg 72.1 kg    Examination:  Awake burt appears weak, R. Facial droop, No new F.N deficits, moves all 4 extremities to pain,  [place].AT,PERRAL Supple Neck,No JVD, No cervical lymphadenopathy appriciated.  Symmetrical Chest wall movement, Good air movement bilaterally, CTAB RRR,No Gallops, Rubs or new Murmurs, No Parasternal Heave +ve B.Sounds, Abd Soft, No tenderness, No organomegaly appriciated, No rebound - guarding or rigidity. No Cyanosis, Clubbing or edema, No new Rash or bruise   CBC: Recent Labs  Lab 01/04/18 2305 01/05/18 0541 01/06/18 1159 01/07/18 0643 01/08/18 0956  WBC 18.1* 17.6* 11.8* 10.0 8.1  NEUTROABS 12.2* 11.4*  --   --   --   HGB 18.8* 13.7 12.8* 11.5* 12.0*  HCT 61.7* 46.4 41.6 37.2* 38.5*  MCV 105.5* 107.7* 105.3* 104.2* 102.1*  PLT 192 141* 119* 116* 117*   Basic Metabolic Panel: Recent Labs  Lab 01/05/18 0906 01/06/18 1159 01/07/18 0359 01/08/18 0956 01/09/18 0508  NA 159* 150* 147* 143 141  K 3.3* 3.3* 3.8 3.6 3.4*  CL 123* 111 111 108 104  CO2 26 27  27 24 27   GLUCOSE 135* 113* 97 91 95  BUN 23 9 6* <5* <5*  CREATININE 0.81 0.60* 0.55* 0.59* 0.67  CALCIUM 8.5* 8.6* 8.2* 8.4* 8.5*  MG  --  1.8  --   --  1.8  PHOS  --  2.9  --   --  1.4*   GFR: Estimated Creatinine Clearance: 78 mL/min (by C-G formula based on SCr of 0.67 mg/dL).  Liver Function Tests: Recent Labs  Lab 01/04/18 2305 01/05/18 0132 01/05/18 0541 01/06/18 1159  AST 40 31 16 23   ALT 20 16 14 13   ALKPHOS 85 59 54 54  BILITOT 1.3* 1.3* 0.6 1.0  PROT 9.5* 7.0 6.5 6.4*  ALBUMIN 4.2 3.1* 2.8* 3.0*    Coagulation Profile: Recent Labs  Lab 01/05/18 0541  INR 1.26    Cardiac Enzymes: Recent Labs  Lab 01/05/18 0132 01/05/18 0541 01/05/18 0906 01/06/18 1159 01/08/18 0956  CKTOTAL 104  --   --   --   --  TROPONINI 0.04* 0.04* 0.04* 0.06* 0.03*    CBG: Recent Labs  Lab 01/08/18 1715 01/08/18 2022 01/09/18 0006 01/09/18 0405 01/09/18 0814  GLUCAP 92 97 107* 88 105*    Recent Results (from the past 240 hour(s))  Urine culture     Status: None   Collection Time: 01/04/18 11:05 PM  Result Value Ref Range Status   Specimen Description URINE, CATHETERIZED  Final   Special Requests NONE  Final   Culture   Final    NO GROWTH Performed at [facility], [address] [place], [place] [address]    Report Status 01/06/2018 FINAL  Final  Blood Culture (routine x 2)     Status: None   Collection Time: 01/04/18 11:17 PM  Result Value Ref Range Status   Specimen Description BLOOD LEFT HAND  Final   Special Requests   Final    BOTTLES DRAWN AEROBIC AND ANAEROBIC Blood Culture adequate volume   Culture   Final    NO GROWTH 5 DAYS Performed at [facility], [address], [place], [place] [address]    Report Status 01/09/2018 FINAL  Final  Blood Culture (routine x 2)     Status: None (Preliminary result)   Collection Time: 01/04/18 11:30 PM  Result Value Ref Range Status   Specimen Description BLOOD RIGHT HAND  Final   Special Requests    Final    BOTTLES DRAWN AEROBIC AND ANAEROBIC Blood Culture adequate volume   Culture   Final    NO GROWTH 4 DAYS Performed at [facility], [address], [place], [place] [address]    Report Status PENDING  Incomplete  MRSA PCR Screening     Status: None   Collection Time: 01/05/18  4:53 PM  Result Value Ref Range Status   MRSA by PCR NEGATIVE NEGATIVE Final    Comment:        The GeneXpert MRSA Assay (FDA approved for NASAL specimens only), is one component of a comprehensive MRSA colonization surveillance program. It is not intended to diagnose MRSA infection nor to guide or monitor treatment for MRSA infections. Performed at [facility], [address], [place], [place] [address]       LOS: 4 days   Signature  Susa Raring M.D on 01/09/2018 at 10:09 AM  To page go to [email or web address] - password [facility]

## 2018-01-09 NOTE — Progress Notes (Signed)
Pt remains obtunded and only responds to painful stimuli. Attempt made to feed dinner but unsuccessful in waking pt up enough to eat. Mouth suctioned for secretions. Will continue to monitor.

## 2018-01-09 NOTE — Progress Notes (Signed)
Initial Nutrition Assessment  DOCUMENTATION CODES:   Not applicable  INTERVENTION:   - Feeding assistance  - Pro-stat 30 ml BID, each supplement provides 100 kcal and 15 grams of protein  NUTRITION DIAGNOSIS:   Inadequate oral intake related to lethargy/confusion as evidenced by per patient/family report.  GOAL:   Patient will meet greater than or equal to 90% of their needs  MONITOR:   PO intake, Supplement acceptance, I & O's, Weight trends, Labs, Other (GOC)  REASON FOR ASSESSMENT:   Low Braden    ASSESSMENT:   72 year old male who presented to the ED on 9/13 from SNF with AMS. PMH significant for bipolar 1 disorder, dementia, dysphagia, schizophrenia, seizures, prior stroke, type 2 diabetes mellitus. Pt admitted with sepsis.  Pt's diet advanced to Dysphagia 1 with thin liquids yesterday. Palliative consult for establishing GOC is pending.  Spoke with RN in room. Pt sleeping and did not arouse to voice or touch.  Per RN, pt is more lethargic today than he was yesterday and did not wake up for breakfast this morning. Pt is full assist with meals. RN does state that pt ate all of lunch meal and all of dinner meal yesterday.  Unable to obtain diet or weight history at time time. Per RN, family is not up-to-date with pt's current condition. No weight history available in chart. Unsure of pt's mobility status at baseline.  Meal Completion: 45-100%  Medications reviewed and include: sliding scale Novolog q 4 hours, 30 mmol sodium phosphate once  Labs reviewed: potassium 3.4 (L), BUN <5 (L), phosphorus 1.4 (L), hemoglobin 12.0 (L), HCT 38.5 (L) CBG's: 111, 105, 88, 107, 87, 92 x 24 hours  NUTRITION - FOCUSED PHYSICAL EXAM:    Most Recent Value  Orbital Region  No depletion  Upper Arm Region  No depletion  Thoracic and Lumbar Region  No depletion  Buccal Region  No depletion  Temple Region  No depletion  Clavicle Bone Region  No depletion  Clavicle and Acromion  Bone Region  Mild depletion  Scapular Bone Region  Unable to assess  Patellar Region  Moderate depletion  Anterior Thigh Region  Moderate depletion  Posterior Calf Region  Moderate depletion  Edema (RD Assessment)  Mild [generalized]  Hair  Reviewed  Eyes  Unable to assess  Mouth  Unable to assess  Skin  Reviewed  Nails  Reviewed       Diet Order:   Diet Order            DIET - DYS 1 Room service appropriate? Yes; Fluid consistency: Thin  Diet effective now              EDUCATION NEEDS:   Not appropriate for education at this time  Skin:  Skin Assessment: Reviewed RN Assessment  Last BM:  01/08/18 - large type 6  Height:   Ht Readings from Last 1 Encounters:  01/05/18 5\' 7"  (1.702 m)    Weight:   Wt Readings from Last 1 Encounters:  01/09/18 72.1 kg    Ideal Body Weight:  67.27 kg  BMI:  Body mass index is 24.9 kg/m.  Estimated Nutritional Needs:   Kcal:  1600-1800  Protein:  80-95 grams  Fluid:  1.6-1.8 L    Earma ReadingKate Jablonski Kelia Gibbon, MS, RD, LDN Inpatient Clinical Dietitian Pager: [phone] Weekend/After Hours: [phone]

## 2018-01-10 ENCOUNTER — Encounter (HOSPITAL_COMMUNITY): Payer: Self-pay | Admitting: Primary Care

## 2018-01-10 DIAGNOSIS — Z7189 Other specified counseling: Secondary | ICD-10-CM

## 2018-01-10 DIAGNOSIS — F0150 Vascular dementia without behavioral disturbance: Secondary | ICD-10-CM

## 2018-01-10 DIAGNOSIS — Z515 Encounter for palliative care: Secondary | ICD-10-CM

## 2018-01-10 LAB — GLUCOSE, CAPILLARY
GLUCOSE-CAPILLARY: 72 mg/dL (ref 70–99)
Glucose-Capillary: 74 mg/dL (ref 70–99)
Glucose-Capillary: 79 mg/dL (ref 70–99)

## 2018-01-10 LAB — CULTURE, BLOOD (ROUTINE X 2)
Culture: NO GROWTH
Special Requests: ADEQUATE

## 2018-01-10 MED ORDER — HALOPERIDOL 2 MG PO TABS
2.0000 mg | ORAL_TABLET | Freq: Once | ORAL | Status: DC
  Filled 2018-01-10: qty 1

## 2018-01-10 MED ORDER — SODIUM CHLORIDE 0.9 % IV BOLUS: 500.0000 mL | Freq: Once | INTRAVENOUS | Status: DC

## 2018-01-10 MED ORDER — AMLODIPINE BESYLATE 10 MG PO TABS: 10.0000 mg | ORAL_TABLET | Freq: Every day | ORAL | Status: AC

## 2018-01-10 MED ORDER — HYDRALAZINE HCL 25 MG PO TABS: 25.0000 mg | ORAL_TABLET | Freq: Three times a day (TID) | ORAL | Status: DC

## 2018-01-10 NOTE — Progress Notes (Signed)
Jory SimsAlexander Bulnes to be D/C'd Skilled nursing facility per MD order.  Discussed with the patient and all questions fully answered.  VSS, Skin clean, dry and intact without evidence of skin break down, no evidence of skin tears noted. IV catheter discontinued intact. Site without signs and symptoms of complications. Dressing and pressure applied.  Report called to [facility]y [facility] at [facility]. Patient transported via [facility].  Casper HarrisonSamantha K [place] 01/10/2018 1:41 PM

## 2018-01-10 NOTE — Discharge Summary (Signed)
Aaron Watkins QMV:[id number] DOB: March 08, 1946 DOA: 01/04/2018  PCP: Mamie Laurel, MD  Admit date: 01/04/2018  Discharge date: 01/10/2018  Admitted From: SNF  Disposition:  SNF   Recommendations for Outpatient Follow-up:   Follow up with PCP in 1-2 weeks  PCP Please obtain BMP/CBC, 2 view CXR in 1week,  (see Discharge instructions)   PCP Please follow up on the following pending results:    Home Health: None   Equipment/Devices: None  Consultations: None Discharge Condition: Fair   CODE STATUS: Full   Diet Recommendation: Dysphagia 1 diet with full feeding assistance and aspiration precautions   Chief Complaint  Patient presents with  . Altered Mental Status     Brief history of present illness from the day of admission and additional interim summary    72 y.o.maleSNF resident w/ a hx of dementia, stroke, diabetes mellitus, schizophrenia and bipolar disorder who was brought to the ER due to AMS. Patient is reportedly nonverbal at baseline.   In the ER patient was tachycardic with fever of 101.4 F and lactate of 4.2. CT head was unremarkable. CT abdom noted a right-sided PNA. Labs also noted polycythemia and hypernatremia.  He failed swallow evaluation couple times, he passed evaluation today, on dysphagia 1 with thickened liquids.  No source of infection could be found, remains afebrile off antibiotics.                                                                  Hospital Course    SIRS v/s Sepsis due to aspiration pneumonia, recurrent dysphagia.  This is a chronic problem.  She is seen by speech therapist, placed on dysphagia 1 diet with feeding assistance and aspiration precautions, has finished antibiotic therapy and sepsis physiology has resolved, all cultures are negative, still appears to  be having a tough time controlling his oral secretions, long-term prognosis is poor, discussed with sister who has poor understanding of patient's disease and morbidity, for now wants him to be continued as full code.  Request SNF MD to kindly continue discussions about making him DNR and discussions about future goals of care.  Note sister wishes the patient to come back to [place] where they live.   Hypertension.    Norvasc added, we had also added hydralazine which I will hold upon discharge as blood pressures are slightly on the lower side.  Sinus bradycardia  Perhaps related to aricept, hold Aricept upon discharge.  Hypernatremia - resolved after hydration with D5W.   Hypokalemia, hypophosphatemia, hypomagnesemia.  All replaced.  He refused labs today.  Kindly recheck at SNF in 3 to 4 days.   Macrocytosis  - B12 and folate are not low, stable TSH  Elevated troponin - trend was flat and and non-ACS pattern, likely due to demand ischemia from sinus  tachycardia when he was admitted in septic.   Acute Toxic/Metabolicencephalopathy - history of dementia - stable and close to baseline now.  Schizophrenia and bipolar disorder - resume home medications.  R. facial drrop -chronic.   Discharge diagnosis     Principal Problem:   Sepsis (HCC) Active Problems:   Acute encephalopathy   Hypernatremia   Diabetes mellitus type 2 in nonobese (HCC)   Schizophrenia (HCC)   Dementia   History of completed stroke    Discharge instructions    Discharge Instructions    Discharge instructions   Complete by:  As directed    Follow with Primary MD Mamie Laurel, MD in 7 days   Get CBC, CMP, 2 view Chest X ray checked  by Primary MD or SNF MD in 5-7 days    Activity: As tolerated with Full fall precautions use walker/cane & assistance as needed  Disposition SNF  Diet: Dysphagia 1 diet, with feeding assistance and aspiration precautions.  For Heart failure patients - Check  your Weight same time everyday, if you gain over 2 pounds, or you develop in leg swelling, experience more shortness of breath or chest pain, call your Primary MD immediately. Follow Cardiac Low Salt Diet and 1.5 lit/day fluid restriction.  On your next visit with your primary care physician please Get Medicines reviewed and adjusted.  Please request your Prim.MD to go over all Hospital Tests and Procedure/Radiological results at the follow up, please get all Hospital records sent to your Prim MD by signing hospital release before you go home.   Increase activity slowly   Complete by:  As directed       Discharge Medications   Allergies as of 01/10/2018   No Known Allergies     Medication List    STOP taking these medications   donepezil 5 MG tablet Commonly known as:  ARICEPT     TAKE these medications   amLODipine 10 MG tablet Commonly known as:  NORVASC Take 1 tablet (10 mg total) by mouth daily.   divalproex 125 MG capsule Commonly known as:  DEPAKOTE SPRINKLE Take 125-250 mg by mouth See admin instructions. Take 2 capsules by mouth 3 times daily (0930, 1330, & 1830) and 1 capsule at bedtime   ipratropium-albuterol 0.5-2.5 (3) MG/3ML Soln Commonly known as:  DUONEB Take 3 mLs by nebulization See admin instructions. Use 1 vial via nebulizer every 12 hours scheduled, and 1 vial via nebulizer every 4 hours as needed for shortness of breath   lactose free nutrition Liqd Take 237 mLs by mouth 3 (three) times daily with meals. Vanilla   risperiDONE 1 MG tablet Commonly known as:  RISPERDAL Take 1 mg by mouth 2 (two) times daily.   sertraline 100 MG tablet Commonly known as:  ZOLOFT Take 100 mg by mouth daily.   vitamin B-12 1000 MCG tablet Commonly known as:  CYANOCOBALAMIN Take 1,000 mcg by mouth daily.       Follow-up Information    Mamie Laurel, MD. Schedule an appointment as soon as possible for a visit in 1 week(s).   Specialty:  Family Medicine Contact  information: [address] [place] [address] [phone]           Major procedures and Radiology Reports - PLEASE review detailed and final reports thoroughly  -        Ct Head Wo Contrast  Result Date: 01/05/2018 CLINICAL DATA:  Altered level of consciousness. EXAM: CT HEAD WITHOUT CONTRAST TECHNIQUE: Contiguous axial  images were obtained from the base of the skull through the vertex without intravenous contrast. COMPARISON:  None. FINDINGS: Brain: Generalized atrophy, advanced for age. Moderate chronic small vessel ischemia. No intracranial hemorrhage, mass effect, or midline shift. Ventricular prominence likely due to central atrophy. The basilar cisterns are patent. No evidence of territorial infarct or acute ischemia. No extra-axial or intracranial fluid collection. Vascular: Atherosclerosis of skullbase vasculature without hyperdense vessel or abnormal calcification. Skull: No fracture or focal lesion. Sinuses/Orbits: Paranasal sinuses and mastoid air cells are clear. The visualized orbits are unremarkable. Dysconjugate gaze. Other: None. IMPRESSION: 1.  No acute intracranial abnormality. 2. Age advanced atrophy with moderate chronic small vessel ischemia. Electronically Signed   By: Narda Rutherford M.D.   On: 01/05/2018 02:49   Ct Abdomen Pelvis W Contrast  Result Date: 01/05/2018 CLINICAL DATA:  Sepsis. History of diabetes, benign prostatic hypertrophy, hyper splenosis. EXAM: CT ABDOMEN AND PELVIS WITH CONTRAST TECHNIQUE: Multidetector CT imaging of the abdomen and pelvis was performed using the standard protocol following bolus administration of intravenous contrast. CONTRAST:  OMNIPAQUE IOHEXOL 300 MG/ML  SOLN COMPARISON:  None. FINDINGS: LOWER CHEST: RIGHT lower lobe peribronchial ground-glass nodules measuring to 6 mm. Bibasilar dependent atelectasis. HEPATOBILIARY: Liver and gallbladder are normal. PANCREAS: Nonacute, Atrophic. SPLEEN: Normal. ADRENALS/URINARY TRACT:  Kidneys are orthotopic, demonstrating symmetric enhancement. No nephrolithiasis, hydronephrosis or solid renal masses. Too small to characterize hypodensities bilateral kidneys. The unopacified ureters are normal in course and caliber. Delayed imaging through the kidneys demonstrates symmetric prompt contrast excretion within the proximal urinary collecting system. Urinary bladder is partially distended and unremarkable. Normal adrenal glands. STOMACH/BOWEL: Mild gas distended distal esophagus seen with reflux. The stomach, small and large bowel are normal in course and caliber without inflammatory changes. Small duodenal diverticulum. Small amount of retained large bowel stool. Mild rectal wall thickening without inflammatory changes associated with internal hemorrhoids. Mild colonic diverticulosis. Normal appendix. VASCULAR/LYMPHATIC: Aortoiliac vessels are normal in course and caliber. Moderate intimal thickening calcific atherosclerosis. Severe stenosis bilateral internal iliac arteries. No lymphadenopathy by CT size criteria. REPRODUCTIVE: Mild prostatomegaly. OTHER: No intraperitoneal free fluid or free air. Small fat containing inguinal hernias. MUSCULOSKELETAL: Nonacute. Mild chronic appearing T12 and L5 superior endplate compression fractures. Moderate to severe lower lumbar spondylosis. Osteopenia. IMPRESSION: 1. RIGHT lower lobe ground-glass nodules may be infectious or inflammatory (potentially from aspiration). Non-contrast chest CT at 3-6 months is recommended. If nodules persist, subsequent management will be based upon the most suspicious nodule(s). This recommendation follows the consensus statement: Guidelines for Management of Incidental Pulmonary Nodules Detected on CT Images: From the Fleischner Society 2017; Radiology 2017; 284:228-243. 2. No acute intra-abdominal or pelvic process. Electronically Signed   By: Awilda Metro M.D.   On: 01/05/2018 03:01   Dg Chest Port 1 View  Result  Date: 01/09/2018 CLINICAL DATA:  Shortness of breath EXAM: PORTABLE CHEST 1 VIEW COMPARISON:  01/04/2018 FINDINGS: Cardiac shadow is within normal limits. Aortic calcifications are seen. The lungs are well aerated bilaterally. No focal infiltrate or sizable effusion is seen. No bony abnormality is noted. IMPRESSION: No active disease. Electronically Signed   By: Alcide Clever M.D.   On: 01/09/2018 11:34   Dg Chest Port 1 View  Result Date: 01/04/2018 CLINICAL DATA:  sepsis EXAM: PORTABLE CHEST 1 VIEW COMPARISON:  None FINDINGS: Midline trachea. Normal heart size. Atherosclerosis in the transverse aorta. No pleural effusion or pneumothorax. Clear lungs. IMPRESSION: No acute cardiopulmonary disease. Aortic Atherosclerosis (ICD10-I70.0). Electronically Signed   By: Ronaldo Miyamoto  Reche Dixon M.D.   On: 01/04/2018 23:48    Micro Results     Recent Results (from the past 240 hour(s))  Urine culture     Status: None   Collection Time: 01/04/18 11:05 PM  Result Value Ref Range Status   Specimen Description URINE, CATHETERIZED  Final   Special Requests NONE  Final   Culture   Final    NO GROWTH Performed at [facility], [address] [place], [place] [address]    Report Status 01/06/2018 FINAL  Final  Blood Culture (routine x 2)     Status: None   Collection Time: 01/04/18 11:17 PM  Result Value Ref Range Status   Specimen Description BLOOD LEFT HAND  Final   Special Requests   Final    BOTTLES DRAWN AEROBIC AND ANAEROBIC Blood Culture adequate volume   Culture   Final    NO GROWTH 5 DAYS Performed at [facility], [address], [place], [place] [address]    Report Status 01/09/2018 FINAL  Final  Blood Culture (routine x 2)     Status: None   Collection Time: 01/04/18 11:30 PM  Result Value Ref Range Status   Specimen Description BLOOD RIGHT HAND  Final   Special Requests   Final    BOTTLES DRAWN AEROBIC AND ANAEROBIC Blood Culture adequate volume   Culture   Final    NO GROWTH 5  DAYS Performed at [facility], [address], [place], [place] [address]    Report Status 01/10/2018 FINAL  Final  MRSA PCR Screening     Status: None   Collection Time: 01/05/18  4:53 PM  Result Value Ref Range Status   MRSA by PCR NEGATIVE NEGATIVE Final    Comment:        The GeneXpert MRSA Assay (FDA approved for NASAL specimens only), is one component of a comprehensive MRSA colonization surveillance program. It is not intended to diagnose MRSA infection nor to guide or monitor treatment for MRSA infections. Performed at [facility], [address], [place], [place] [address]     Today   Subjective    Aaron Watkins today is in his bed.  In no distress, answers within not of the head or with one-word answers, denies any headache or chest pain.  Does not follow commands reliably.   Objective   Blood pressure (!) 100/52, pulse 98, temperature 97.6 F (36.4 C), temperature source Oral, resp. rate 18, height 5\' 7"  (1.702 m), weight 72.4 kg, SpO2 100 %.   Intake/Output Summary (Last 24 hours) at 01/10/2018 0844 Last data filed at 01/10/2018 0300 Gross per 24 hour  Intake 269.45 ml  Output 2100 ml  Net -1830.55 ml    Exam  Awake , No new F.N deficits, old right-sided facial droop [place].AT,PERRAL Supple Neck,No JVD, No cervical lymphadenopathy appriciated.  Symmetrical Chest wall movement, Good air movement bilaterally, CTAB RRR,No Gallops,Rubs or new Murmurs, No Parasternal Heave +ve B.Sounds, Abd Soft, Non tender, No organomegaly appriciated, No rebound -guarding or rigidity. No Cyanosis, Clubbing or edema, No new Rash or bruise   Data Review   CBC w Diff:  Lab Results  Component Value Date   WBC 8.1 01/08/2018   HGB 12.0 (L) 01/08/2018   HCT 38.5 (L) 01/08/2018   PLT 117 (L) 01/08/2018   LYMPHOPCT 27 01/05/2018   MONOPCT 8 01/05/2018   EOSPCT 0 01/05/2018   BASOPCT 0 01/05/2018    CMP:  Lab Results  Component Value Date   NA 141  01/09/2018   K 3.4 (L) 01/09/2018   CL 104 01/09/2018   CO2 27 01/09/2018   BUN <5 (L) 01/09/2018   CREATININE 0.67 01/09/2018   PROT 6.4 (L) 01/06/2018   ALBUMIN 3.0 (L) 01/06/2018   BILITOT 1.0 01/06/2018   ALKPHOS 54 01/06/2018   AST 23 01/06/2018   ALT 13 01/06/2018  .   Total Time in preparing paper work, data evaluation and todays exam - 35 minutes  Susa RaringPrashant Elijiah Mickley M.D on 01/10/2018 at 8:44 AM  [facility]   Office  [phone]

## 2018-01-10 NOTE — Progress Notes (Signed)
   01/10/18 1121  Vitals  BP (!) 97/57  BP Location Right Arm  BP Method Automatic  Patient Position (if appropriate) Lying  retaken in right arm, provider discontinued bolus order, okay to discharge.

## 2018-01-10 NOTE — Consult Note (Addendum)
Consultation Note Date: 01/10/2018   Patient Name: Aaron Watkins  DOB: 05-10-1945  MRN: [id number]  Age / Sex: 72 y.o., male  PCP: Mamie Laurelooper, Emanuel, MD Referring Physician: Leroy SeaSingh, Prashant K, MD  Reason for Consultation: Establishing goals of care  HPI/Patient Profile: 72 y.o. male  with past medical history of dysphasia, schizophrenia, seizures, vascular dementia with behavioral disturbance, high blood pressure and cholesterol, diabetes, BPH admitted on 01/04/2018 with SIRS versus sepsis due to aspiration pneumonia, recurrent dysphasia.   Clinical Assessment and Goals of Care: Aaron Watkins is lying quietly in bed.  His eyes are open, but he does not look in my direction.  I speak to him, asking questions, but he does not reply.  He does not try to make his basic needs known.  He makes no response if I ask if he is thirsty, wants something to drink.  There is no family at bedside at this time.  Conference with social worker related to disposition plan.  Goal is return to SNF.  Sister would like Aaron Watkins to be moved closer to [place].  Call to sister, Guy SandiferBertha Caddell at [phone].  [name] states that her brother likes to be called Aaron Watkins, and not Aaron Watkins, Aaron Watkins makes him angry.  [name] does a quick life review for her brother stating that when he was no longer able to work, he moved in with her.  She shares that he would walk down to their brother's home which is nearby, but there came a point where she was no longer able to care for him.  We talked about his mental state, and [name] shares that he was talking on Saturday, telling her that he "wanted his banana pudding".  [name] goes further to say that Aaron Watkins has not been talker for most of his life, when he did speak it was very fast.  She also states that he is very hard of hearing.  We talked about his chronic illnesses including dementia.  We talked  about dementia as a chronic, progressive, life limiting illness.  [name] states that someone has talked to her about making him comfortable, but she does not want to do this.  She tells me that his sister was made comfort care last year and she is still upset about this.  We talked about what is normal and expected, that Aaron Watkins will get sick again.  She states she understands, and agrees.  We talked about disposition plan to [facility].  I encouraged [name] to work with the Child psychotherapistsocial worker on-site for transfer to a facility closer to her.  She tells me that they have sister sites nearby, and she will work with the Child psychotherapistsocial worker on-site.  We talked about healthcare power of attorney, see below.   We also talked about CODE STATUS.  [name] states that she wants for the medical team to "resuscitate him".  I share that we would try, this is not always successful.  Also talk about the concept of "treat the treatable, but  when his heart naturally stops, when God calls, we let him go".  Doristine Counter talks about her faith.  She states that when God calls we should let him go.  She tells me that she does not have any more time to talk, as she was to pick up a friend.  I share that I need to verify what she is stating.  At this point, she continues to endorse ATTEMPTING resuscitation.  I encouraged her to consider treating the treatable, but when God calls to give him comfort and dignity.  Steward Drone states she will consider these choices.  Healthcare power of attorney HCPOA, Sister Omari Mcmanaway states she has durable and HC POA.  Mr. Dahm had no spouse, no kids.  There were 10 children in their family, 6 her left.  She states that a sister comes to [facility] monthly to check on Aaron Watkins, their oldest brother is at the same facility.      SUMMARY OF RECOMMENDATIONS   Continue to treat the treatable.  Return to [facility] with the goal of transitioning to a sister facility near [place].  Continue CODE  STATUS discussion using the language of: ATTEMPTING/TRYING resuscitation "treat the treatable, but when his heart naturally stops, when God calls, we let him go".  Code Status/Advance Care Planning:  Full code  Symptom Management:   Per hospitalist, no additional needs at this time.  Palliative Prophylaxis:   Aspiration and Turn Reposition  Additional Recommendations (Limitations, Scope, Preferences):  Full Scope Treatment  Psycho-social/Spiritual:   Desire for further Chaplaincy support:no  Additional Recommendations: Caregiving  Support/Resources and Education on Hospice  Prognosis:   < 6 months, or less would not be surprising based on poor functional status, advancing dementia, aspiration pneumonia with recurrent dysphasia.  Discharge Planning: Return to [facility] with a goal to move closer to sister in [place].      Primary Diagnoses: Present on Admission: . Sepsis (HCC) . Acute encephalopathy . Hypernatremia . Schizophrenia (HCC) . Dementia   I have reviewed the medical record, interviewed the patient and family, and examined the patient. The following aspects are pertinent.  Past Medical History:  Diagnosis Date  . Abnormal posture   . Acute kidney failure (HCC)   . Bacteremia   . Bipolar 1 disorder (HCC)   . BPH (benign prostatic hyperplasia)   . Convulsions (HCC)   . Dementia   . Diabetes mellitus without complication (HCC)   . Dysphagia   . Dysphagia   . Hypersplenism   . Metabolic encephalopathy   . Schizophrenia (HCC)   . Seizures (HCC)   . Stroke (HCC)   . Thrombocytopenia (HCC)   . Vascular dementia with behavior disturbance   . Vitamin B12 deficiency    Social History   Socioeconomic History  . Marital status: Single    Spouse name: Not on file  . Number of children: Not on file  . Years of education: Not on file  . Highest education level: Not on file  Occupational History  . Not on file  Social Needs  . Financial  resource strain: Not on file  . Food insecurity:    Worry: Not on file    Inability: Not on file  . Transportation needs:    Medical: Not on file    Non-medical: Not on file  Tobacco Use  . Smoking status: Never Smoker  . Smokeless tobacco: Never Used  Substance and Sexual Activity  . Alcohol use: Not on file  .  Drug use: Not on file  . Sexual activity: Not on file  Lifestyle  . Physical activity:    Days per week: Not on file    Minutes per session: Not on file  . Stress: Not on file  Relationships  . Social connections:    Talks on phone: Not on file    Gets together: Not on file    Attends religious service: Not on file    Active member of club or organization: Not on file    Attends meetings of clubs or organizations: Not on file    Relationship status: Not on file  Other Topics Concern  . Not on file  Social History Narrative  . Not on file   Family History  Family history unknown: Yes   Scheduled Meds: . amLODipine  10 mg Oral Daily  . chlorhexidine  15 mL Mouth Rinse BID  . enoxaparin (LOVENOX) injection  40 mg Subcutaneous Daily  . feeding supplement (PRO-STAT SUGAR FREE 64)  30 mL Oral BID  . insulin aspart  0-9 Units Subcutaneous Q4H  . mouth rinse  15 mL Mouth Rinse q12n4p   Continuous Infusions: PRN Meds:.acetaminophen, hydrALAZINE, ipratropium-albuterol, [DISCONTINUED] ondansetron **OR** ondansetron (ZOFRAN) IV Medications Prior to Admission:  Prior to Admission medications   Medication Sig Start Date End Date Taking? Authorizing Provider  divalproex (DEPAKOTE SPRINKLE) 125 MG capsule Take 125-250 mg by mouth See admin instructions. Take 2 capsules by mouth 3 times daily (0930, 1330, & 1830) and 1 capsule at bedtime   Yes [provider]  donepezil (ARICEPT) 5 MG tablet Take 5 mg by mouth at bedtime.   Yes [provider]  ipratropium-albuterol (DUONEB) 0.5-2.5 (3) MG/3ML SOLN Take 3 mLs by nebulization See admin instructions. Use 1  vial via nebulizer every 12 hours scheduled, and 1 vial via nebulizer every 4 hours as needed for shortness of breath   Yes [provider]  lactose free nutrition (BOOST) LIQD Take 237 mLs by mouth 3 (three) times daily with meals. Vanilla   Yes [provider]  risperiDONE (RISPERDAL) 1 MG tablet Take 1 mg by mouth 2 (two) times daily.   Yes [provider]  sertraline (ZOLOFT) 100 MG tablet Take 100 mg by mouth daily.   Yes [provider]  vitamin B-12 (CYANOCOBALAMIN) 1000 MCG tablet Take 1,000 mcg by mouth daily.   Yes [provider]  amLODipine (NORVASC) 10 MG tablet Take 1 tablet (10 mg total) by mouth daily. 01/10/18   Leroy Sea, MD   No Known Allergies Review of Systems  Unable to perform ROS: Dementia    Physical Exam  Constitutional: No distress.  Appears acutely/chronically ill, frail.  Does not make eye contact.  HENT:  Head: Atraumatic.  Cardiovascular: Normal rate.  Pulmonary/Chest: Effort normal. No respiratory distress.  Abdominal: Soft. He exhibits no distension. There is no guarding.  Musculoskeletal: He exhibits no edema.  Neurological: He is alert.  Known dementia  Skin: Skin is warm and dry.  Psychiatric:  Calm and cooperative, not fearful  Nursing note and vitals reviewed.   Vital Signs: BP (!) 100/52 (BP Location: Left Arm)   Pulse 98   Temp 97.6 F (36.4 C) (Oral)   Resp 18   Ht 5\' 7"  (1.702 m)   Wt 72.4 kg   SpO2 100%   BMI 25.00 kg/m  Pain Scale: 0-10   Pain Score: 0-No pain   SpO2: SpO2: 100 % O2 Device:SpO2: 100 % O2  Flow Rate: .O2 Flow Rate (L/min): 2 L/min  IO: Intake/output summary:   Intake/Output Summary (Last 24 hours) at 01/10/2018 1039 Last data filed at 01/10/2018 0300 Gross per 24 hour  Intake 269.45 ml  Output 2100 ml  Net -1830.55 ml    LBM: Last BM Date: 01/09/18 Baseline Weight: Weight: 70.3 kg Most recent weight: Weight: 72.4 kg     Palliative  Assessment/Data:   Flowsheet Rows     Most Recent Value  Intake Tab  Referral Department  Hospitalist  Unit at Time of Referral  Cardiac/Telemetry Unit  Palliative Care Primary Diagnosis  Neurology  Date Notified  01/09/18  Palliative Care Type  New Palliative care  Reason for referral  Clarify Goals of Care  Date of Admission  01/04/18  Date first seen by Palliative Care  01/10/18  # of days Palliative referral response time  1 Day(s)  # of days IP prior to Palliative referral  5  Clinical Assessment  Palliative Performance Scale Score  30%  Pain Max last 24 hours  Not able to report  Pain Min Last 24 hours  Not able to report  Dyspnea Max Last 24 Hours  Not able to report  Dyspnea Min Last 24 hours  Not able to report  Psychosocial & Spiritual Assessment  Palliative Care Outcomes  Patient/Family meeting held?  Yes  Who was at the meeting?  call to sister Drevin Ortner  Palliative Care Outcomes  Provided advance care planning, Clarified goals of care, Provided psychosocial or spiritual support      Time In: 0900 Time Out: 1010 Time Total: 70 minutes Greater than 50%  of this time was spent counseling and coordinating care related to the above assessment and plan.  Signed by: Katheran Awe, NP   Please contact Palliative Medicine Team phone at [phone] for questions and concerns.  For individual provider: See Loretha Stapler

## 2018-01-10 NOTE — Discharge Instructions (Signed)
Follow with Primary MD Mamie Laurelooper, Emanuel, MD in 7 days   Get CBC, CMP, 2 view Chest X ray checked  by Primary MD or SNF MD in 5-7 days    Activity: As tolerated with Full fall precautions use walker/cane & assistance as needed  Disposition SNF  Diet: Dysphagia 1 diet, with feeding assistance and aspiration precautions.  For Heart failure patients - Check your Weight same time everyday, if you gain over 2 pounds, or you develop in leg swelling, experience more shortness of breath or chest pain, call your Primary MD immediately. Follow Cardiac Low Salt Diet and 1.5 lit/day fluid restriction.  On your next visit with your primary care physician please Get Medicines reviewed and adjusted.  Please request your Prim.MD to go over all Hospital Tests and Procedure/Radiological results at the follow up, please get all Hospital records sent to your Prim MD by signing hospital release before you go home.

## 2018-01-10 NOTE — Progress Notes (Signed)
Spoke with pt's nurse Sam, IV consult canceled. Pt no longer needs bolus.

## 2018-01-10 NOTE — Clinical Social Work Note (Signed)
Clinical Social Work Assessment  Patient Details  Name: Aaron Watkins MRN: [id number] Date of Birth: [birth year]  Date of referral:  01/08/18               Reason for consult:  Discharge Planning, Facility Placement                Permission sought to share information with:  Facility Medical sales representativeContact Representative, Family Supports Permission granted to share information::  No  Name::     Aeronautical engineerBertha  Agency::  [facility]  Relationship::  Sister  Contact Information:  [phone]  Housing/Transportation Living arrangements for the past 2 months:  Skilled Nursing Facility Source of Information:  Siblings Patient Interpreter Needed:  None Criminal Activity/Legal Involvement Pertinent to Current Situation/Hospitalization:  No - Comment as needed Significant Relationships:  Siblings Lives with:  Facility Resident Do you feel safe going back to the place where you live?  Yes Need for family participation in patient care:  Yes (Comment)  Care giving concerns:  CSW received consult regarding discharge planning. CSW spoke with patient's sister. She reports patient is a long term resident at [facility] and will return at discharge. CSW to continue to follow and assist with discharge planning needs.   Social Worker assessment / plan:  CSW spoke with patient's sister regarding patient's discharge.  Employment status:  Retired Health and safety inspectornsurance information:  Medicare PT Recommendations:  Not assessed at this time Information / Referral to community resources:  Skilled Nursing Facility  Patient/Family's Response to care:  Patient's sister reports agreement with discharge plan back to [facility] today and requests PTAR for transport. She hopes to one day move the patient to live near her.   Patient/Family's Understanding of and Emotional Response to Diagnosis, Current Treatment, and Prognosis:  Patient/family is realistic regarding therapy needs and expressed being hopeful for return to placement. Patient's  sister expressed understanding of CSW role and discharge process as well as medical condition. No questions/concerns about plan or treatment.    Emotional Assessment Appearance:  Appears stated age Attitude/Demeanor/Rapport:  Unable to Assess Affect (typically observed):  Unable to Assess Orientation:  Oriented to Self, Oriented to Place, Oriented to  Time, Oriented to Situation Alcohol / Substance use:  Not Applicable Psych involvement (Current and /or in the community):  No (Comment)  Discharge Needs  Concerns to be addressed:  Care Coordination Readmission within the last 30 days:  No Current discharge risk:  None Barriers to Discharge:  No Barriers Identified   Mearl Latinadia S Mauri Tolen, LCSWA 01/10/2018, 10:44 AM

## 2018-01-10 NOTE — Progress Notes (Signed)
   01/10/18 1054  Vitals  BP (!) 88/62  BP Method Manual  Holding Norvasc and Hydralazine, Provider made aware orders given.

## 2018-01-10 NOTE — Progress Notes (Signed)
Patient will DC to: [facility] Anticipated DC date: 01/10/18 Family notified: Sister, [name] Transport by: Sharin MonsPTAR   Per MD patient ready for DC to [facility]. RN, patient, patient's family, and facility notified of DC. Discharge Summary sent to facility. RN given number for report [phone]). DC packet on chart. Ambulance transport requested for patient.   CSW signing off.  Cristobal GoldmannNadia Apollo Timothy, LCSW Clinical Social Worker [phone]

## 2019-05-26 DEATH — deceased

## 2020-03-31 IMAGING — DX DG CHEST 1V PORT
1 series · 1 of 1 positions shown · non-contrast
Comparison: 01/04/2018

CLINICAL DATA: Shortness of breath

EXAM:
PORTABLE CHEST 1 VIEW

[chest ap]
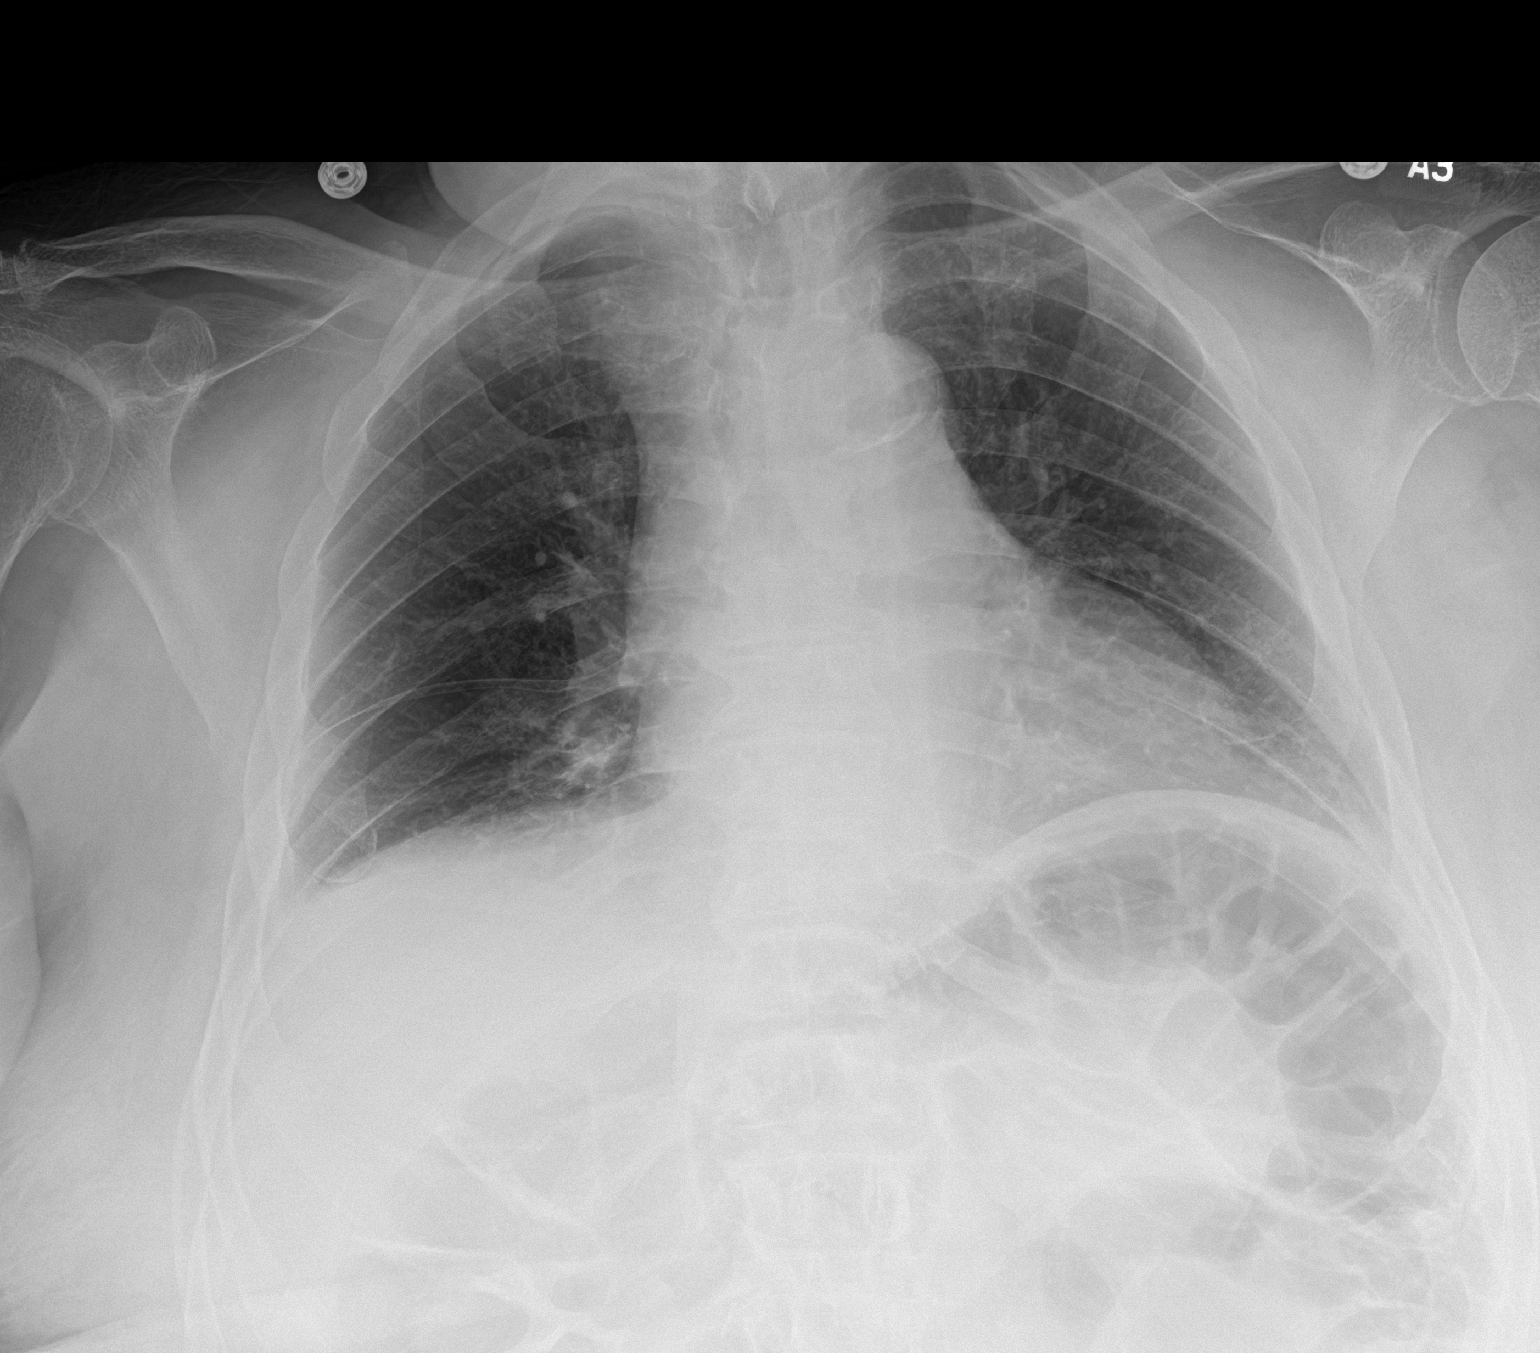

[1 of 1 positions shown; findings below may reference images not displayed]

FINDINGS: Cardiac shadow is within normal limits. Aortic calcifications are
seen. The lungs are well aerated bilaterally. No focal infiltrate or
sizable effusion is seen. No bony abnormality is noted.
IMPRESSION: No active disease.
